# Patient Record
Sex: Female | Born: 1935 | Race: White | Hispanic: No | Marital: Married | Smoking: Never smoker
Health system: Southern US, Community
[De-identification: ages and names within clinical notes are randomized; demographics above are authoritative.]

## PROBLEM LIST (undated history)

## (undated) DIAGNOSIS — I10 Essential (primary) hypertension: Secondary | ICD-10-CM

## (undated) DIAGNOSIS — E039 Hypothyroidism, unspecified: Secondary | ICD-10-CM

---

## 2005-01-02 ENCOUNTER — Ambulatory Visit: Payer: Self-pay | Admitting: Internal Medicine

## 2005-06-20 ENCOUNTER — Ambulatory Visit: Payer: Self-pay | Admitting: Internal Medicine

## 2005-07-12 ENCOUNTER — Ambulatory Visit: Payer: Self-pay | Admitting: Surgery

## 2006-01-22 ENCOUNTER — Ambulatory Visit: Payer: Self-pay | Admitting: Internal Medicine

## 2007-01-30 ENCOUNTER — Ambulatory Visit: Payer: Self-pay | Admitting: Internal Medicine

## 2008-02-03 ENCOUNTER — Ambulatory Visit: Payer: Self-pay | Admitting: Internal Medicine

## 2009-02-03 ENCOUNTER — Ambulatory Visit: Payer: Self-pay | Admitting: Internal Medicine

## 2009-02-15 ENCOUNTER — Ambulatory Visit: Payer: Self-pay | Admitting: Internal Medicine

## 2009-08-19 ENCOUNTER — Ambulatory Visit: Payer: Self-pay | Admitting: Internal Medicine

## 2010-02-06 ENCOUNTER — Ambulatory Visit: Payer: Self-pay | Admitting: Internal Medicine

## 2011-04-04 ENCOUNTER — Ambulatory Visit: Payer: Self-pay | Admitting: Internal Medicine

## 2011-05-18 ENCOUNTER — Ambulatory Visit: Payer: Self-pay | Admitting: Unknown Physician Specialty

## 2011-05-21 ENCOUNTER — Ambulatory Visit: Payer: Self-pay | Admitting: Internal Medicine

## 2012-04-04 ENCOUNTER — Ambulatory Visit: Payer: Self-pay | Admitting: Internal Medicine

## 2013-04-06 ENCOUNTER — Ambulatory Visit: Payer: Self-pay | Admitting: Internal Medicine

## 2013-12-25 ENCOUNTER — Ambulatory Visit: Payer: Self-pay | Admitting: Internal Medicine

## 2014-03-24 ENCOUNTER — Emergency Department: Payer: Self-pay | Admitting: Emergency Medicine

## 2014-03-24 LAB — URINALYSIS, COMPLETE
BILIRUBIN, UR: NEGATIVE
Bacteria: NONE SEEN
Glucose,UR: NEGATIVE mg/dL (ref 0–75)
KETONE: NEGATIVE
NITRITE: NEGATIVE
Ph: 8 (ref 4.5–8.0)
Protein: NEGATIVE
RBC,UR: 16 /HPF (ref 0–5)
SPECIFIC GRAVITY: 1.006 (ref 1.003–1.030)
Squamous Epithelial: 2
WBC UR: 10 /HPF (ref 0–5)

## 2014-03-24 LAB — BASIC METABOLIC PANEL
ANION GAP: 8 (ref 7–16)
BUN: 13 mg/dL (ref 7–18)
CALCIUM: 8.7 mg/dL (ref 8.5–10.1)
CHLORIDE: 99 mmol/L (ref 98–107)
Co2: 32 mmol/L (ref 21–32)
Creatinine: 0.74 mg/dL (ref 0.60–1.30)
EGFR (Non-African Amer.): >60
GLUCOSE: 118 mg/dL — AB (ref 65–99)
OSMOLALITY: 279 (ref 275–301)
Potassium: 3.5 mmol/L (ref 3.5–5.1)
Sodium: 139 mmol/L (ref 136–145)

## 2014-03-24 LAB — TROPONIN I
TROPONIN-I: 0.02 ng/mL
Troponin-I: 0.03 ng/mL

## 2014-03-24 LAB — CBC
HCT: 40 % (ref 35.0–47.0)
HGB: 13.5 g/dL (ref 12.0–16.0)
MCH: 30.7 pg (ref 26.0–34.0)
MCHC: 33.7 g/dL (ref 32.0–36.0)
MCV: 91 fL (ref 80–100)
PLATELETS: 318 10*3/uL (ref 150–440)
RBC: 4.39 10*6/uL (ref 3.80–5.20)
RDW: 14.5 % (ref 11.5–14.5)
WBC: 8.7 10*3/uL (ref 3.6–11.0)

## 2014-03-25 LAB — URINE CULTURE

## 2014-04-14 ENCOUNTER — Ambulatory Visit: Payer: Self-pay | Admitting: Gastroenterology

## 2014-05-06 ENCOUNTER — Ambulatory Visit: Payer: Self-pay | Admitting: Internal Medicine

## 2014-06-03 ENCOUNTER — Emergency Department: Payer: Self-pay | Admitting: Emergency Medicine

## 2014-06-03 LAB — CBC
HCT: 39.7 % (ref 35.0–47.0)
HGB: 13.1 g/dL (ref 12.0–16.0)
MCH: 31.6 pg (ref 26.0–34.0)
MCHC: 33 g/dL (ref 32.0–36.0)
MCV: 96 fL (ref 80–100)
PLATELETS: 242 10*3/uL (ref 150–440)
RBC: 4.16 10*6/uL (ref 3.80–5.20)
RDW: 14 % (ref 11.5–14.5)
WBC: 6.4 10*3/uL (ref 3.6–11.0)

## 2014-06-03 LAB — PROTIME-INR
INR: 1.2
PROTHROMBIN TIME: 14.9 s — AB (ref 11.5–14.7)

## 2018-05-06 ENCOUNTER — Inpatient Hospital Stay
Admission: EM | Admit: 2018-05-06 | Discharge: 2018-05-13 | DRG: 065 | Disposition: A | Discharge status: Hospice | Payer: Medicare Other | Attending: Internal Medicine | Admitting: Internal Medicine

## 2018-05-06 ENCOUNTER — Other Ambulatory Visit: Payer: Self-pay

## 2018-05-06 ENCOUNTER — Inpatient Hospital Stay: Payer: Medicare Other

## 2018-05-06 ENCOUNTER — Emergency Department: Payer: Medicare Other

## 2018-05-06 DIAGNOSIS — L8990 Pressure ulcer of unspecified site, unspecified stage: Secondary | ICD-10-CM

## 2018-05-06 DIAGNOSIS — H5340 Unspecified visual field defects: Secondary | ICD-10-CM | POA: Diagnosis present

## 2018-05-06 DIAGNOSIS — F419 Anxiety disorder, unspecified: Secondary | ICD-10-CM | POA: Diagnosis present

## 2018-05-06 DIAGNOSIS — I4891 Unspecified atrial fibrillation: Secondary | ICD-10-CM | POA: Diagnosis present

## 2018-05-06 DIAGNOSIS — I639 Cerebral infarction, unspecified: Secondary | ICD-10-CM | POA: Diagnosis present

## 2018-05-06 DIAGNOSIS — K219 Gastro-esophageal reflux disease without esophagitis: Secondary | ICD-10-CM | POA: Diagnosis present

## 2018-05-06 DIAGNOSIS — I63032 Cerebral infarction due to thrombosis of left carotid artery: Secondary | ICD-10-CM | POA: Diagnosis present

## 2018-05-06 DIAGNOSIS — R471 Dysarthria and anarthria: Secondary | ICD-10-CM | POA: Diagnosis present

## 2018-05-06 DIAGNOSIS — I499 Cardiac arrhythmia, unspecified: Secondary | ICD-10-CM | POA: Diagnosis not present

## 2018-05-06 DIAGNOSIS — R4701 Aphasia: Secondary | ICD-10-CM | POA: Diagnosis present

## 2018-05-06 DIAGNOSIS — I471 Supraventricular tachycardia: Secondary | ICD-10-CM | POA: Diagnosis present

## 2018-05-06 DIAGNOSIS — G459 Transient cerebral ischemic attack, unspecified: Secondary | ICD-10-CM

## 2018-05-06 DIAGNOSIS — I959 Hypotension, unspecified: Secondary | ICD-10-CM | POA: Diagnosis not present

## 2018-05-06 DIAGNOSIS — Z8249 Family history of ischemic heart disease and other diseases of the circulatory system: Secondary | ICD-10-CM

## 2018-05-06 DIAGNOSIS — E876 Hypokalemia: Secondary | ICD-10-CM | POA: Diagnosis not present

## 2018-05-06 DIAGNOSIS — E782 Mixed hyperlipidemia: Secondary | ICD-10-CM | POA: Diagnosis present

## 2018-05-06 DIAGNOSIS — J189 Pneumonia, unspecified organism: Secondary | ICD-10-CM

## 2018-05-06 DIAGNOSIS — Z888 Allergy status to other drugs, medicaments and biological substances status: Secondary | ICD-10-CM

## 2018-05-06 DIAGNOSIS — I739 Peripheral vascular disease, unspecified: Secondary | ICD-10-CM | POA: Diagnosis present

## 2018-05-06 DIAGNOSIS — Z515 Encounter for palliative care: Secondary | ICD-10-CM | POA: Diagnosis not present

## 2018-05-06 DIAGNOSIS — Z7902 Long term (current) use of antithrombotics/antiplatelets: Secondary | ICD-10-CM

## 2018-05-06 DIAGNOSIS — Z880 Allergy status to penicillin: Secondary | ICD-10-CM

## 2018-05-06 DIAGNOSIS — G9349 Other encephalopathy: Secondary | ICD-10-CM | POA: Diagnosis present

## 2018-05-06 DIAGNOSIS — Z7982 Long term (current) use of aspirin: Secondary | ICD-10-CM

## 2018-05-06 DIAGNOSIS — M6282 Rhabdomyolysis: Secondary | ICD-10-CM | POA: Diagnosis present

## 2018-05-06 DIAGNOSIS — R000 Tachycardia, unspecified: Secondary | ICD-10-CM | POA: Diagnosis not present

## 2018-05-06 DIAGNOSIS — Z79899 Other long term (current) drug therapy: Secondary | ICD-10-CM

## 2018-05-06 DIAGNOSIS — G8101 Flaccid hemiplegia affecting right dominant side: Secondary | ICD-10-CM | POA: Diagnosis present

## 2018-05-06 DIAGNOSIS — Z66 Do not resuscitate: Secondary | ICD-10-CM | POA: Diagnosis present

## 2018-05-06 DIAGNOSIS — N3289 Other specified disorders of bladder: Secondary | ICD-10-CM | POA: Diagnosis not present

## 2018-05-06 DIAGNOSIS — J9811 Atelectasis: Secondary | ICD-10-CM | POA: Diagnosis not present

## 2018-05-06 DIAGNOSIS — L89152 Pressure ulcer of sacral region, stage 2: Secondary | ICD-10-CM | POA: Diagnosis not present

## 2018-05-06 DIAGNOSIS — R29712 NIHSS score 12: Secondary | ICD-10-CM | POA: Diagnosis present

## 2018-05-06 DIAGNOSIS — I4892 Unspecified atrial flutter: Secondary | ICD-10-CM | POA: Diagnosis present

## 2018-05-06 DIAGNOSIS — Z79890 Hormone replacement therapy (postmenopausal): Secondary | ICD-10-CM

## 2018-05-06 DIAGNOSIS — E039 Hypothyroidism, unspecified: Secondary | ICD-10-CM | POA: Diagnosis present

## 2018-05-06 DIAGNOSIS — R531 Weakness: Secondary | ICD-10-CM | POA: Diagnosis present

## 2018-05-06 DIAGNOSIS — Z882 Allergy status to sulfonamides status: Secondary | ICD-10-CM

## 2018-05-06 DIAGNOSIS — Z881 Allergy status to other antibiotic agents status: Secondary | ICD-10-CM

## 2018-05-06 DIAGNOSIS — I5030 Unspecified diastolic (congestive) heart failure: Secondary | ICD-10-CM | POA: Diagnosis not present

## 2018-05-06 DIAGNOSIS — Z794 Long term (current) use of insulin: Secondary | ICD-10-CM

## 2018-05-06 HISTORY — DX: Hypothyroidism, unspecified: E03.9

## 2018-05-06 HISTORY — DX: Essential (primary) hypertension: I10

## 2018-05-06 LAB — CBC
HEMATOCRIT: 44.8 % (ref 36.0–46.0)
HEMOGLOBIN: 15.5 g/dL — AB (ref 12.0–15.0)
MCH: 30.6 pg (ref 26.0–34.0)
MCHC: 34.6 g/dL (ref 30.0–36.0)
MCV: 88.4 fL (ref 80.0–100.0)
NRBC: 0 % (ref 0.0–0.2)
Platelets: 246 10*3/uL (ref 150–400)
RBC: 5.07 MIL/uL (ref 3.87–5.11)
RDW: 12.9 % (ref 11.5–15.5)
WBC: 15.2 10*3/uL — AB (ref 4.0–10.5)

## 2018-05-06 LAB — DIFFERENTIAL
Abs Immature Granulocytes: 0.08 10*3/uL — ABNORMAL HIGH (ref 0.00–0.07)
BASOS ABS: 0 10*3/uL (ref 0.0–0.1)
Basophils Relative: 0 %
Eosinophils Absolute: 0 10*3/uL (ref 0.0–0.5)
Eosinophils Relative: 0 %
IMMATURE GRANULOCYTES: 1 %
LYMPHS ABS: 0.7 10*3/uL (ref 0.7–4.0)
LYMPHS PCT: 5 %
Monocytes Absolute: 0.9 10*3/uL (ref 0.1–1.0)
Monocytes Relative: 6 %
NEUTROS ABS: 13.6 10*3/uL — AB (ref 1.7–7.7)
Neutrophils Relative %: 88 %

## 2018-05-06 LAB — ETHANOL

## 2018-05-06 LAB — URINALYSIS, COMPLETE (UACMP) WITH MICROSCOPIC
BILIRUBIN URINE: NEGATIVE
Bacteria, UA: NONE SEEN
Glucose, UA: 50 mg/dL — AB
Ketones, ur: 20 mg/dL — AB
LEUKOCYTES UA: NEGATIVE
Nitrite: NEGATIVE
Protein, ur: 100 mg/dL — AB
RBC / HPF: >50 RBC/hpf — ABNORMAL HIGH (ref 0–5)
SPECIFIC GRAVITY, URINE: 1.016 (ref 1.005–1.030)
pH: 6 (ref 5.0–8.0)

## 2018-05-06 LAB — PROTIME-INR
INR: 1.18
PROTHROMBIN TIME: 14.9 s (ref 11.4–15.2)

## 2018-05-06 LAB — URINE DRUG SCREEN, QUALITATIVE (ARMC ONLY)
Amphetamines, Ur Screen: NOT DETECTED
BARBITURATES, UR SCREEN: NOT DETECTED
Benzodiazepine, Ur Scrn: POSITIVE — AB
COCAINE METABOLITE, UR ~~LOC~~: NOT DETECTED
Cannabinoid 50 Ng, Ur ~~LOC~~: NOT DETECTED
MDMA (Ecstasy)Ur Screen: NOT DETECTED
METHADONE SCREEN, URINE: NOT DETECTED
OPIATE, UR SCREEN: NOT DETECTED
PHENCYCLIDINE (PCP) UR S: NOT DETECTED
Tricyclic, Ur Screen: NOT DETECTED

## 2018-05-06 LAB — COMPREHENSIVE METABOLIC PANEL
ALK PHOS: 58 U/L (ref 38–126)
ALT: 28 U/L (ref 0–44)
AST: 61 U/L — ABNORMAL HIGH (ref 15–41)
Albumin: 4.1 g/dL (ref 3.5–5.0)
Anion gap: 15 (ref 5–15)
BILIRUBIN TOTAL: 2 mg/dL — AB (ref 0.3–1.2)
BUN: 21 mg/dL (ref 8–23)
CALCIUM: 9.2 mg/dL (ref 8.9–10.3)
CO2: 22 mmol/L (ref 22–32)
Chloride: 102 mmol/L (ref 98–111)
Creatinine, Ser: 0.63 mg/dL (ref 0.44–1.00)
GFR calc Af Amer: >60 mL/min (ref >60)
GFR calc non Af Amer: >60 mL/min (ref >60)
Glucose, Bld: 126 mg/dL — ABNORMAL HIGH (ref 70–99)
Potassium: 3.4 mmol/L — ABNORMAL LOW (ref 3.5–5.1)
SODIUM: 139 mmol/L (ref 135–145)
Total Protein: 7.1 g/dL (ref 6.5–8.1)

## 2018-05-06 LAB — CK: CK TOTAL: 1678 U/L — AB (ref 38–234)

## 2018-05-06 LAB — TROPONIN I: Troponin I: 0.03 ng/mL (ref <0.03)

## 2018-05-06 LAB — APTT: aPTT: 32 seconds (ref 24–36)

## 2018-05-06 MED ORDER — LORAZEPAM 2 MG/ML IJ SOLN: 0.5000 mg | Freq: Four times a day (QID) | INTRAMUSCULAR | Status: DC | PRN

## 2018-05-06 MED ORDER — SODIUM CHLORIDE 0.9 % IV SOLN
INTRAVENOUS | Status: DC
  Administered 2018-05-06 – 2018-05-07 (×3): via INTRAVENOUS

## 2018-05-06 MED ORDER — METOPROLOL SUCCINATE ER 50 MG PO TB24: 200.0000 mg | ORAL_TABLET | Freq: Every day | ORAL | Status: DC

## 2018-05-06 MED ORDER — ASPIRIN 325 MG PO TABS
325.0000 mg | ORAL_TABLET | Freq: Every day | ORAL | Status: DC
  Administered 2018-05-10 – 2018-05-12 (×3): 325 mg via ORAL
  Filled 2018-05-06 (×5): qty 1

## 2018-05-06 MED ORDER — STROKE: EARLY STAGES OF RECOVERY BOOK
Freq: Once | Status: AC
  Administered 2018-05-06: 19:00:00

## 2018-05-06 MED ORDER — ACETAMINOPHEN 650 MG RE SUPP: 650.0000 mg | RECTAL | Status: DC | PRN

## 2018-05-06 MED ORDER — LEVOTHYROXINE SODIUM 50 MCG PO TABS
50.0000 ug | ORAL_TABLET | Freq: Every day | ORAL | Status: DC
  Administered 2018-05-10 – 2018-05-13 (×4): 50 ug via ORAL
  Filled 2018-05-06 (×4): qty 1

## 2018-05-06 MED ORDER — ACETAMINOPHEN 160 MG/5ML PO SOLN
650.0000 mg | ORAL | Status: DC | PRN
  Filled 2018-05-06: qty 20.3

## 2018-05-06 MED ORDER — ASPIRIN 300 MG RE SUPP
300.0000 mg | Freq: Every day | RECTAL | Status: DC
  Administered 2018-05-07 – 2018-05-13 (×4): 300 mg via RECTAL
  Filled 2018-05-06 (×7): qty 1

## 2018-05-06 MED ORDER — SODIUM CHLORIDE 0.9 % IV BOLUS
1000.0000 mL | Freq: Once | INTRAVENOUS | Status: AC
  Administered 2018-05-06: 1000 mL via INTRAVENOUS

## 2018-05-06 MED ORDER — HYDRALAZINE HCL 20 MG/ML IJ SOLN
10.0000 mg | Freq: Four times a day (QID) | INTRAMUSCULAR | Status: DC | PRN
  Filled 2018-05-06: qty 1

## 2018-05-06 MED ORDER — ACETAMINOPHEN 325 MG PO TABS
650.0000 mg | ORAL_TABLET | ORAL | Status: DC | PRN
  Filled 2018-05-06: qty 2

## 2018-05-06 MED ORDER — ENOXAPARIN SODIUM 40 MG/0.4ML ~~LOC~~ SOLN
40.0000 mg | SUBCUTANEOUS | Status: DC
  Administered 2018-05-06 – 2018-05-12 (×7): 40 mg via SUBCUTANEOUS
  Filled 2018-05-06 (×7): qty 0.4

## 2018-05-06 NOTE — H&P (Signed)
[facility] [place] [facility]   PATIENT NAME: Jenny Molina    MR#:  [id number]  DATE OF BIRTH:  1923-11-24  DATE OF ADMISSION:  05/06/2018  PRIMARY CARE PHYSICIAN: Patient, No Pcp Per   REQUESTING/REFERRING PHYSICIAN: Dr. Scotty Court   CHIEF COMPLAINT:   Chief Complaint  Patient presents with  . Fall    HISTORY OF PRESENT ILLNESS:  Jenny Molina  is a 84 y.o. female with a known history of hypertension, hypothyroidism who is a resident of an independent living facility was found on the floor with last well-known 3 days back.  Patient is drowsy but answers a few questions.  From history from her facility she has been on the floor for 2 to 3 days.  Here she has been found to have right hemiparesis with left internal capsule CVA.  CK is elevated at 1600.  Blood pressure is elevated. Patient is being admitted for acute CVA and rhabdomyolysis.  PAST MEDICAL HISTORY:   Past Medical History:  Diagnosis Date  . HTN (hypertension)   . Hypothyroidism     PAST SURGICAL HISTORY:  History reviewed. No pertinent surgical history.  SOCIAL HISTORY:   Social History   Tobacco Use  . Smoking status: Unknown If Ever Smoked  Substance Use Topics  . Alcohol use: Not Currently    Frequency: Never    Comment: unable to verbalize    FAMILY HISTORY:   Family History  Problem Relation Age of Onset  . Hypertension Mother   . CAD Father     DRUG ALLERGIES:  Not on File  REVIEW OF SYSTEMS:   Review of Systems  Unable to perform ROS: Mental status change    MEDICATIONS AT HOME:   Prior to Admission medications   Medication Sig Start Date End Date Taking? Authorizing Provider  ALPRAZolam Prudy Feeler) 0.5 MG tablet Take 1 tablet by mouth every 8 (eight) hours. 04/01/18  Yes [provider]  amLODipine (NORVASC) 5 MG tablet Take 1 tablet by mouth daily. 01/06/18  Yes [provider]  furosemide (LASIX) 20 MG tablet Take 1 tablet by mouth daily. 04/08/18  04/08/19 Yes [provider]  levothyroxine (SYNTHROID, LEVOTHROID) 50 MCG tablet Take 1 tablet by mouth daily before breakfast. 05/10/17  Yes [provider]  metoprolol (TOPROL-XL) 200 MG 24 hr tablet Take 1 tablet by mouth daily. 05/10/17  Yes [provider]     VITAL SIGNS:  Blood pressure (!) 157/70, pulse 68, temperature 98.8 F (37.1 C), temperature source Oral, resp. rate 15, height 5\' 5"  (1.651 m), weight 68 kg, SpO2 100 %.  PHYSICAL EXAMINATION:  Physical Exam  GENERAL:  84 y.o.-year-old patient lying in the bed with no acute distress.  EYES: Pupils equal, round, reactive to light and accommodation. No scleral icterus. Extraocular muscles intact.  HEENT: Head atraumatic, normocephalic. Oropharynx and nasopharynx clear. No oropharyngeal erythema, moist oral mucosa  NECK:  Supple, no jugular venous distention. No thyroid enlargement, no tenderness.  LUNGS: Normal breath sounds bilaterally, no wheezing, rales, rhonchi. No use of accessory muscles of respiration.  CARDIOVASCULAR: S1, S2 normal. No murmurs, rubs, or gallops.  ABDOMEN: Soft, nontender, nondistended. Bowel sounds present. No organomegaly or mass.  EXTREMITIES: No pedal edema, cyanosis, or clubbing. + 2 pedal & radial pulses b/l.   NEUROLOGIC: Right hemiparesis PSYCHIATRIC: The patient is drwozy SKIN: No obvious rash, lesion, or ulcer.   LABORATORY PANEL:   CBC Recent Labs  Lab 05/06/18 1419  WBC 15.2*  HGB 15.5*  HCT 44.8  PLT 246   ------------------------------------------------------------------------------------------------------------------  Chemistries  Recent Labs  Lab 05/06/18 1419  NA 139  K 3.4*  CL 102  CO2 22  GLUCOSE 126*  BUN 21  CREATININE 0.63  CALCIUM 9.2  AST 61*  ALT 28  ALKPHOS 58  BILITOT 2.0*   ------------------------------------------------------------------------------------------------------------------  Cardiac Enzymes Recent Labs  Lab  05/06/18 1419  TROPONINI 0.03*   ------------------------------------------------------------------------------------------------------------------  RADIOLOGY:  Ct Head Wo Contrast  Result Date: 05/06/2018 CLINICAL DATA:  84 year old female fell and presumed to be on floor for 3 days per neighbor. Right-sided weakness. Initial encounter. EXAM: CT HEAD WITHOUT CONTRAST CT CERVICAL SPINE WITHOUT CONTRAST TECHNIQUE: Multidetector CT imaging of the head and cervical spine was performed following the standard protocol without intravenous contrast. Multiplanar CT image reconstructions of the cervical spine were also generated. COMPARISON:  12/25/2013 CT. FINDINGS: CT HEAD FINDINGS Brain: Age-indeterminate infarct versus prominent chronic microvascular changes genu of the left internal capsule. Otherwise no CT evidence of large acute infarct. No intracranial hemorrhage. Prominent chronic microvascular changes. Global atrophy. No intracranial mass lesion noted on this unenhanced exam. Vascular: No hyperdense vessel. Skull: No skull fracture. Sinuses/Orbits: Post lens replacement. No acute orbital abnormality. Mucosal thickening ethmoid sinus air cells. Other: Mastoid air cells and middle ear cavities are clear. CT CERVICAL SPINE FINDINGS Alignment: Mild rotation head and C1 upon C2. Skull base and vertebrae: No cervical spine fracture. Soft tissues and spinal canal: No abnormal prevertebral soft tissue swelling. Disc levels: Cervical spondylotic changes most notable C4-5 through C6-7. No high-grade spinal stenosis. Upper chest: No worrisome abnormality. Other: No worrisome neck mass.  Carotid bifurcation calcifications. IMPRESSION: CT HEAD 1. Age-indeterminate infarct versus prominent chronic microvascular changes genu of the left internal capsule. Otherwise no CT evidence of large acute infarct. 2. No skull fracture or intracranial hemorrhage. 3. Prominent chronic microvascular changes. 4. Global atrophy. CT  CERVICAL SPINE 1. Mild rotation head/C1 upon C2 may be related to head position. 2. No cervical spine fracture or abnormal prevertebral soft tissue swelling. 3. Spondylotic changes most notable C4-5 through C6-7. No high-grade spinal stenosis. Electronically Signed   By: Lacy Duverney M.D.   On: 05/06/2018 15:27   Ct Cervical Spine Wo Contrast  Result Date: 05/06/2018 CLINICAL DATA:  84 year old female fell and presumed to be on floor for 3 days per neighbor. Right-sided weakness. Initial encounter. EXAM: CT HEAD WITHOUT CONTRAST CT CERVICAL SPINE WITHOUT CONTRAST TECHNIQUE: Multidetector CT imaging of the head and cervical spine was performed following the standard protocol without intravenous contrast. Multiplanar CT image reconstructions of the cervical spine were also generated. COMPARISON:  12/25/2013 CT. FINDINGS: CT HEAD FINDINGS Brain: Age-indeterminate infarct versus prominent chronic microvascular changes genu of the left internal capsule. Otherwise no CT evidence of large acute infarct. No intracranial hemorrhage. Prominent chronic microvascular changes. Global atrophy. No intracranial mass lesion noted on this unenhanced exam. Vascular: No hyperdense vessel. Skull: No skull fracture. Sinuses/Orbits: Post lens replacement. No acute orbital abnormality. Mucosal thickening ethmoid sinus air cells. Other: Mastoid air cells and middle ear cavities are clear. CT CERVICAL SPINE FINDINGS Alignment: Mild rotation head and C1 upon C2. Skull base and vertebrae: No cervical spine fracture. Soft tissues and spinal canal: No abnormal prevertebral soft tissue swelling. Disc levels: Cervical spondylotic changes most notable C4-5 through C6-7. No high-grade spinal stenosis. Upper chest: No worrisome abnormality. Other: No worrisome neck mass.  Carotid bifurcation calcifications. IMPRESSION: CT HEAD 1. Age-indeterminate infarct versus prominent chronic microvascular  changes genu of the left internal capsule.  Otherwise no CT evidence of large acute infarct. 2. No skull fracture or intracranial hemorrhage. 3. Prominent chronic microvascular changes. 4. Global atrophy. CT CERVICAL SPINE 1. Mild rotation head/C1 upon C2 may be related to head position. 2. No cervical spine fracture or abnormal prevertebral soft tissue swelling. 3. Spondylotic changes most notable C4-5 through C6-7. No high-grade spinal stenosis. Electronically Signed   By: Lacy Duverney M.D.   On: 05/06/2018 15:27   Ct Pelvis Wo Contrast  Result Date: 05/06/2018 CLINICAL DATA:  Patient status post fall at some point over the past 3 days. Pelvic pain. Initial encounter. EXAM: CT PELVIS WITHOUT CONTRAST TECHNIQUE: Multidetector CT imaging of the pelvis was performed following the standard protocol without intravenous contrast. COMPARISON:  CT abdomen and pelvis 03/24/2014. FINDINGS: Urinary Tract: The urinary bladder is completely decompressed with a Foley catheter in place. Bowel: Sigmoid diverticulosis is noted. Imaged bowel loops are otherwise unremarkable. Vascular/Lymphatic: Atherosclerotic vascular disease is seen. No lymphadenopathy. Reproductive:  No mass or other significant abnormality Other:  No fluid collection. Musculoskeletal: The hips are located. No fracture is identified. Small bone islands in the right sacrum and left ilium are unchanged. Mild right hip joint space narrowing is identified. Mild degenerative change is also seen at the symphysis pubis. There is some facet arthropathy and a disc bulge at L5-S1. IMPRESSION: Negative for fracture.  No acute abnormality. Lower lumbar spondylosis. Mild degenerative change right hip and symphysis pubis also noted. Atherosclerosis. Diverticulosis. Electronically Signed   By: Drusilla Kanner M.D.   On: 05/06/2018 15:15   Dg Chest Portable 1 View  Result Date: 05/06/2018 CLINICAL DATA:  Right-sided weakness, fall EXAM: PORTABLE CHEST 1 VIEW COMPARISON:  03/24/2014, CT 05/06/2014 FINDINGS:  The patient's hand obscures the left lower chest. Small right-sided pleural effusion. Enlarged cardiomediastinal silhouette with vascular congestion and hazy atelectasis or edema at the bases. No pneumothorax. IMPRESSION: 1. Cardiomegaly with vascular congestion and hazy bibasilar atelectasis or mild edema. 2. Tiny right pleural effusion Electronically Signed   By: Jasmine Pang M.D.   On: 05/06/2018 14:58     IMPRESSION AND PLAN:   *Acute CVA of left internal capsule -Check MRI of the brain, Carotid dopplers, Echo - Start aspirin .  Statin once patient able to swallow - Lovenox for DVT prophylaxis. - PT/OT/Speech consult as needed per symptoms - Neuro checks every 4 hours for 24 hours. - Consult neurology.  *Uncontrolled hypertension.  Will restart metoprolol tomorrow.  Hold amlodipine.  Permissive hypertension.  *Hypothyroidism.  Levothyroxine and patient can swallow  *Acute rhabdomyolysis.  Start IV fluids.  Repeat CK in the morning.  Normal renal function.  DVT prophylaxis with Lovenox    All the records are reviewed and case discussed with ED provider. Management plans discussed with the patient, family and they are in agreement.  CODE STATUS: FULL CODE  TOTAL TIME TAKING CARE OF THIS PATIENT: 40 minutes.   Molinda Bailiff Ehsan Corvin M.D on 05/06/2018 at 4:28 PM  Between 7am to 6pm - Pager - [phone]  After 6pm go to [email or web address] - password [email or web address]  [facility]  Office  [phone]  CC: Primary care physician; Patient, No Pcp Per  Note: This dictation was prepared with Dragon dictation along with smaller phrase technology. Any transcriptional errors that result from this process are unintentional.

## 2018-05-06 NOTE — ED Provider Notes (Signed)
[facility] Emergency Department Provider Note  ____________________________________________  Time seen: Approximately 3:31 PM  I have reviewed the triage vital signs and the nursing notes.   HISTORY  Chief Complaint Fall  Level 5 Caveat: Portions of the History and Physical including HPI and review of systems are unable to be completely obtained due to patient being a poor historian    HPI Jenny Molina is a 84 y.o. female with a past history of hypothyroidism, hypertension who was found in her home at [facility] independent living on the floor having fallen.  Last known well 3 days ago.  With right-sided weakness and a aphasia according to EMS.  Patient is unable to provide any significant history.  She does endorse headache.      History reviewed. No pertinent past medical history.  Allergies Reconcile with Patient's Chart  Active Allergy Reactions Severity Noted Date Comments  Olmesartan Unknown  09/30/2013   Ciprofloxacin (Bulk) Unknown  09/30/2013   Hydralazine Other (See Comments)  07/28/2014 Felt like she couldn't walk or see among other things   Hydrochlorothiazide (Bulk) Unknown  09/30/2013   Penicillins Unknown  09/30/2013   Sulfa (Sulfonamide Antibiotics) Unknown  09/30/2013   Medications Reconcile with Patient's Chart  Medication Sig Dispensed Refills Start Date End Date Status  levothyroxine (SYNTHROID, LEVOTHROID) 50 MCG tablet  Indications: Acquired hypothyroidism, unspecified Take 1 tablet (50 mcg total) by mouth once daily Take on an empty stomach with a glass of water at least 30-60 minutes before breakfast. 30 tablet  11 05/10/2017  Active  metoprolol succinate (TOPROL-XL) 200 MG XL tablet  Indications: Essential hypertension Take 1 tablet (200 mg total) by mouth once daily 30 tablet  11 05/10/2017  Active  amLODIPine (NORVASC) 5 MG tablet  Take 1 tablet (5 mg total) by mouth once daily 30 tablet  6 01/06/2018   Active  ALPRAZolam (XANAX) 0.5 MG tablet  Indications: Anxiety Take 1 tablet (0.5 mg total) by mouth every 8 (eight) hours 120 tablet  3 04/01/2018  Active  FUROsemide (LASIX) 20 MG tablet  Take 1 tablet (20 mg total) by mouth once daily 30 tablet  11 04/08/2018 04/08/2019 Active  Active Problems Reconcile with Patient's Chart  Problem Noted Date  Pedal edema 04/08/2018  SOB (shortness of breath) 04/08/2018  HTN (hypertension) 11/19/2013  Hyperlipidemia 11/19/2013  Hypothyroidism 11/19/2013  Anxiety 11/19/2013  OA (osteoarthritis) 11/19/2013  Hypertension   Palpitations   Non-cardiac chest pain   Fibrocystic disease of breast   Osteoarthritis   Allergic rhinitis   Migraine   GERD (gastroesophageal reflux disease)   Hematuria   Overview:   microscopic hematuria       Allergies Patient has no allergy information on record.   History reviewed. No pertinent family history.  Social History Social History   Tobacco Use  . Smoking status: Unknown If Ever Smoked  Substance Use Topics  . Alcohol use: Not Currently    Frequency: Never    Comment: unable to verbalize  . Drug use: Not Currently    Comment: unable to verbalize    Review of Systems Level 5 Caveat: Portions of the History and Physical including HPI and review of systems are unable to be completely obtained due to patient being a poor historian due to altered mental status  ____________________________________________   PHYSICAL EXAM:  VITAL SIGNS: ED Triage Vitals  Enc Vitals Group     BP 05/06/18 1412 (!) 155/74     Pulse Rate 05/06/18  1412 68     Resp 05/06/18 1412 17     Temp 05/06/18 1412 98.8 F (37.1 C)     Temp Source 05/06/18 1412 Oral     SpO2 05/06/18 1412 97 %     Weight 05/06/18 1414 150 lb (68 kg)     Height 05/06/18 1414 5\' 5"  (1.651 m)     Head Circumference --      Peak Flow --      Pain Score 05/06/18 1414 10     Pain Loc --      Pain Edu? --      Excl. in  GC? --     Vital signs reviewed, nursing assessments reviewed.   Constitutional:   Awake and alert.  Ill-appearing . Eyes:   Conjunctivae are normal.  Right lateral gaze palsy. ENT      Head:   Normocephalic and atraumatic.      Nose:   No congestion/rhinnorhea.       Mouth/Throat:   Dry mucous membranes, no pharyngeal erythema. No peritonsillar mass.       Neck:   No meningismus. Full ROM. Hematological/Lymphatic/Immunilogical:   No cervical lymphadenopathy. Cardiovascular:   RRR. Symmetric bilateral radial and DP pulses.  No murmurs. Cap refill less than 2 seconds. Respiratory:   Normal respiratory effort without tachypnea/retractions. Breath sounds are clear and equal bilaterally. No wheezes/rales/rhonchi. Gastrointestinal:   Soft and nontender. Non distended. There is no CVA tenderness.  No rebound, rigidity, or guarding.  Musculoskeletal:   Normal range of motion in all extremities. No joint effusions.  No lower extremity tenderness.  No edema. Neurologic:   Dysarthric speech.  Limited language range Right upper extremity paralysis Right lower extremity paralysis but present withdrawal from painful stimulus Left side motor unremarkable Sensation intact NIH stroke scale approximately 12   Skin:    Skin is warm, dry and intact. No rash noted.  No petechiae, purpura, or bullae.  ____________________________________________    LABS (pertinent positives/negatives) (all labs ordered are listed, but only abnormal results are displayed) Labs Reviewed  CBC - Abnormal; Notable for the following components:      Result Value   WBC 15.2 (*)    Hemoglobin 15.5 (*)    All other components within normal limits  DIFFERENTIAL - Abnormal; Notable for the following components:   Neutro Abs 13.6 (*)    Abs Immature Granulocytes 0.08 (*)    All other components within normal limits  URINE DRUG SCREEN, QUALITATIVE ([facility] ONLY) - Abnormal; Notable for the following components:    Benzodiazepine, Ur Scrn POSITIVE (*)    All other components within normal limits  URINALYSIS, COMPLETE (UACMP) WITH MICROSCOPIC - Abnormal; Notable for the following components:   Color, Urine YELLOW (*)    APPearance CLEAR (*)    Glucose, UA 50 (*)    Hgb urine dipstick LARGE (*)    Ketones, ur 20 (*)    Protein, ur 100 (*)    RBC / HPF >50 (*)    All other components within normal limits  ETHANOL  PROTIME-INR  APTT  COMPREHENSIVE METABOLIC PANEL  TROPONIN I  CK   ____________________________________________   EKG  Interpreted by me Sinus rhythm rate of 69, normal axis intervals QRS ST segments and T waves  ____________________________________________    RADIOLOGY  Ct Head Wo Contrast  Result Date: 05/06/2018 CLINICAL DATA:  84 year old female fell and presumed to be on floor for 3 days per neighbor. Right-sided weakness. Initial encounter.  EXAM: CT HEAD WITHOUT CONTRAST CT CERVICAL SPINE WITHOUT CONTRAST TECHNIQUE: Multidetector CT imaging of the head and cervical spine was performed following the standard protocol without intravenous contrast. Multiplanar CT image reconstructions of the cervical spine were also generated. COMPARISON:  12/25/2013 CT. FINDINGS: CT HEAD FINDINGS Brain: Age-indeterminate infarct versus prominent chronic microvascular changes genu of the left internal capsule. Otherwise no CT evidence of large acute infarct. No intracranial hemorrhage. Prominent chronic microvascular changes. Global atrophy. No intracranial mass lesion noted on this unenhanced exam. Vascular: No hyperdense vessel. Skull: No skull fracture. Sinuses/Orbits: Post lens replacement. No acute orbital abnormality. Mucosal thickening ethmoid sinus air cells. Other: Mastoid air cells and middle ear cavities are clear. CT CERVICAL SPINE FINDINGS Alignment: Mild rotation head and C1 upon C2. Skull base and vertebrae: No cervical spine fracture. Soft tissues and spinal canal: No abnormal  prevertebral soft tissue swelling. Disc levels: Cervical spondylotic changes most notable C4-5 through C6-7. No high-grade spinal stenosis. Upper chest: No worrisome abnormality. Other: No worrisome neck mass.  Carotid bifurcation calcifications. IMPRESSION: CT HEAD 1. Age-indeterminate infarct versus prominent chronic microvascular changes genu of the left internal capsule. Otherwise no CT evidence of large acute infarct. 2. No skull fracture or intracranial hemorrhage. 3. Prominent chronic microvascular changes. 4. Global atrophy. CT CERVICAL SPINE 1. Mild rotation head/C1 upon C2 may be related to head position. 2. No cervical spine fracture or abnormal prevertebral soft tissue swelling. 3. Spondylotic changes most notable C4-5 through C6-7. No high-grade spinal stenosis. Electronically Signed   By: Lacy Duverney M.D.   On: 05/06/2018 15:27   Ct Cervical Spine Wo Contrast  Result Date: 05/06/2018 CLINICAL DATA:  84 year old female fell and presumed to be on floor for 3 days per neighbor. Right-sided weakness. Initial encounter. EXAM: CT HEAD WITHOUT CONTRAST CT CERVICAL SPINE WITHOUT CONTRAST TECHNIQUE: Multidetector CT imaging of the head and cervical spine was performed following the standard protocol without intravenous contrast. Multiplanar CT image reconstructions of the cervical spine were also generated. COMPARISON:  12/25/2013 CT. FINDINGS: CT HEAD FINDINGS Brain: Age-indeterminate infarct versus prominent chronic microvascular changes genu of the left internal capsule. Otherwise no CT evidence of large acute infarct. No intracranial hemorrhage. Prominent chronic microvascular changes. Global atrophy. No intracranial mass lesion noted on this unenhanced exam. Vascular: No hyperdense vessel. Skull: No skull fracture. Sinuses/Orbits: Post lens replacement. No acute orbital abnormality. Mucosal thickening ethmoid sinus air cells. Other: Mastoid air cells and middle ear cavities are clear. CT CERVICAL  SPINE FINDINGS Alignment: Mild rotation head and C1 upon C2. Skull base and vertebrae: No cervical spine fracture. Soft tissues and spinal canal: No abnormal prevertebral soft tissue swelling. Disc levels: Cervical spondylotic changes most notable C4-5 through C6-7. No high-grade spinal stenosis. Upper chest: No worrisome abnormality. Other: No worrisome neck mass.  Carotid bifurcation calcifications. IMPRESSION: CT HEAD 1. Age-indeterminate infarct versus prominent chronic microvascular changes genu of the left internal capsule. Otherwise no CT evidence of large acute infarct. 2. No skull fracture or intracranial hemorrhage. 3. Prominent chronic microvascular changes. 4. Global atrophy. CT CERVICAL SPINE 1. Mild rotation head/C1 upon C2 may be related to head position. 2. No cervical spine fracture or abnormal prevertebral soft tissue swelling. 3. Spondylotic changes most notable C4-5 through C6-7. No high-grade spinal stenosis. Electronically Signed   By: Lacy Duverney M.D.   On: 05/06/2018 15:27   Ct Pelvis Wo Contrast  Result Date: 05/06/2018 CLINICAL DATA:  Patient status post fall at some point over the past 3  days. Pelvic pain. Initial encounter. EXAM: CT PELVIS WITHOUT CONTRAST TECHNIQUE: Multidetector CT imaging of the pelvis was performed following the standard protocol without intravenous contrast. COMPARISON:  CT abdomen and pelvis 03/24/2014. FINDINGS: Urinary Tract: The urinary bladder is completely decompressed with a Foley catheter in place. Bowel: Sigmoid diverticulosis is noted. Imaged bowel loops are otherwise unremarkable. Vascular/Lymphatic: Atherosclerotic vascular disease is seen. No lymphadenopathy. Reproductive:  No mass or other significant abnormality Other:  No fluid collection. Musculoskeletal: The hips are located. No fracture is identified. Small bone islands in the right sacrum and left ilium are unchanged. Mild right hip joint space narrowing is identified. Mild degenerative  change is also seen at the symphysis pubis. There is some facet arthropathy and a disc bulge at L5-S1. IMPRESSION: Negative for fracture.  No acute abnormality. Lower lumbar spondylosis. Mild degenerative change right hip and symphysis pubis also noted. Atherosclerosis. Diverticulosis. Electronically Signed   By: Drusilla Kanner M.D.   On: 05/06/2018 15:15   Dg Chest Portable 1 View  Result Date: 05/06/2018 CLINICAL DATA:  Right-sided weakness, fall EXAM: PORTABLE CHEST 1 VIEW COMPARISON:  03/24/2014, CT 05/06/2014 FINDINGS: The patient's hand obscures the left lower chest. Small right-sided pleural effusion. Enlarged cardiomediastinal silhouette with vascular congestion and hazy atelectasis or edema at the bases. No pneumothorax. IMPRESSION: 1. Cardiomegaly with vascular congestion and hazy bibasilar atelectasis or mild edema. 2. Tiny right pleural effusion Electronically Signed   By: Jasmine Pang M.D.   On: 05/06/2018 14:58    ____________________________________________   PROCEDURES Procedures  ____________________________________________    CLINICAL IMPRESSION / ASSESSMENT AND PLAN / ED COURSE  Pertinent labs & imaging results that were available during my care of the patient were reviewed by me and considered in my medical decision making (see chart for details).    Patient presents with likely acute ischemic stroke versus intracranial hemorrhage.  Stat CT had obtained which does show a ischemic stroke in the left motor tract.  No intracranial hemorrhage noted.  She also has elevated CK level from soft tissue compression injury.  CT negative for hip fracture.  Plan to admit for further stroke management.  Not a candidate for tpa or endovascular intervention given LKW 3 days ago.      ____________________________________________   FINAL CLINICAL IMPRESSION(S) / ED DIAGNOSES    Final diagnoses:  Acute ischemic stroke [facility])     ED Discharge Orders    None       Portions of this note were generated with dragon dictation software. Dictation errors may occur despite best attempts at proofreading.    Sharman Cheek, MD 05/06/18 [phone]

## 2018-05-06 NOTE — ED Triage Notes (Addendum)
Pt to ED from [facility] c/o fall. Per EMS pt fell and is assumed to have been on the floor for at least x3 days per neighborhood who usually talks to her every day. Pt presents with right side weakness and deficits as well as a bruise to her right hip.

## 2018-05-07 ENCOUNTER — Inpatient Hospital Stay (HOSPITAL_COMMUNITY)
Admit: 2018-05-07 | Discharge: 2018-05-07 | Disposition: A | Discharge status: Home / Self Care | Payer: Medicare Other | Attending: Internal Medicine | Admitting: Internal Medicine

## 2018-05-07 ENCOUNTER — Inpatient Hospital Stay: Payer: Medicare Other

## 2018-05-07 DIAGNOSIS — I5030 Unspecified diastolic (congestive) heart failure: Secondary | ICD-10-CM

## 2018-05-07 LAB — GLUCOSE, CAPILLARY
GLUCOSE-CAPILLARY: 80 mg/dL (ref 70–99)
GLUCOSE-CAPILLARY: 75 mg/dL (ref 70–99)
Glucose-Capillary: 74 mg/dL (ref 70–99)
Glucose-Capillary: 68 mg/dL — ABNORMAL LOW (ref 70–99)

## 2018-05-07 LAB — LIPID PANEL
CHOL/HDL RATIO: 3.1 ratio
CHOLESTEROL: 150 mg/dL (ref 0–200)
HDL: 49 mg/dL (ref >40)
LDL CALC: 87 mg/dL (ref 0–99)
Triglycerides: 71 mg/dL (ref <150)
VLDL: 14 mg/dL (ref 0–40)

## 2018-05-07 LAB — ECHOCARDIOGRAM COMPLETE
Height: 65 in
WEIGHTICAEL: 1824 [oz_av]

## 2018-05-07 LAB — HEMOGLOBIN A1C
HEMOGLOBIN A1C: 5.5 % (ref 4.8–5.6)
MEAN PLASMA GLUCOSE: 111 mg/dL

## 2018-05-07 LAB — CK: CK TOTAL: 539 U/L — AB (ref 38–234)

## 2018-05-07 MED ORDER — SODIUM CHLORIDE 0.9 % IV SOLN
100.0000 mg | Freq: Two times a day (BID) | INTRAVENOUS | Status: DC
  Administered 2018-05-07 – 2018-05-10 (×6): 100 mg via INTRAVENOUS
  Filled 2018-05-07 (×10): qty 10

## 2018-05-07 MED ORDER — INSULIN ASPART 100 UNIT/ML ~~LOC~~ SOLN: 0.0000 [IU] | Freq: Three times a day (TID) | SUBCUTANEOUS | Status: DC

## 2018-05-07 MED ORDER — IOHEXOL 350 MG/ML SOLN
75.0000 mL | Freq: Once | INTRAVENOUS | Status: AC | PRN
  Administered 2018-05-07: 13:00:00 75 mL via INTRAVENOUS

## 2018-05-07 NOTE — Progress Notes (Signed)
OT Cancellation Note  Patient Details Name: Jenny Molina MRN: [id number] DOB: [birth year]   Cancelled Treatment:    Reason Eval/Treat Not Completed: Patient at procedure or test/ unavailable. Order received, chart reviewed. Pt out of room for testing. Will re-attempt OT evaluation at later date/time as pt is available and medically appropriate.  Richrd Prime, MPH, MS, OTR/L ascom [phone] 05/07/18, 9:36 AM

## 2018-05-07 NOTE — Progress Notes (Addendum)
SLP Cancellation Note  Patient Details Name: Jenny Molina MRN: [id number] DOB: Jul 06, 1924   Cancelled treatment:       Reason Eval/Treat Not Completed: Patient not medically ready;Fatigue/lethargy limiting ability to participate;Patient at procedure or test/unavailable(chart reviewed; consulted NSG). Pt was being taken from room for test. Noted pt to be lethargic w/ head turn completely to Left side. NSG reported lethargy this morning. Noted MRI results.  ST services will f/u this PM for determination of appropriateness for BSE today vs tomorrow; MD felt pt would need to hold on BSE until tomorrow. NSG updated/agreed.   [name], MS, CCC-SLP Jenny Molina 05/07/2018, 9:59 AM

## 2018-05-07 NOTE — Progress Notes (Signed)
[employer] - [place] [facility]                                                                                                                                                                                  Patient Demographics   Jhania Etherington, is a 84 y.o. female, DOB - Jul 27, 1923, JXB:[id number]  Admit date - 05/06/2018   Admitting Physician Milagros Loll, MD  Outpatient Primary MD for the patient is Patient, No Pcp Per   LOS - 1  Subjective: Pt open eyes, not able to follow commands    Review of Systems:   CONSTITUTIONAL: unable to provide.    Vitals:   Vitals:   05/07/18 0338 05/07/18 0542 05/07/18 1000 05/07/18 1156  BP: (!) 153/66 135/64 128/70 (!) 158/66  Pulse: 70 71 77 66  Resp: 14 20 18 18   Temp: 98 F (36.7 C) (!) 97.4 F (36.3 C) 98 F (36.7 C) 98.6 F (37 C)  TempSrc: Oral Oral Oral Oral  SpO2: 96% 93% 94% 95%  Weight:      Height:        Wt Readings from Last 3 Encounters:  05/06/18 51.8 kg  05/07/18 51.7 kg     Intake/Output Summary (Last 24 hours) at 05/07/2018 1349 Last data filed at 05/07/2018 0441 Gross per 24 hour  Intake 1592.94 ml  Output 1800 ml  Net -207.06 ml    Physical Exam:   GENERAL: Pleasant-appearing in no apparent distress.  HEAD, EYES, EARS, NOSE AND THROAT: Atraumatic, normocephalic. Extraocular muscles are intact. Pupils equal and reactive to light. Sclerae anicteric. No conjunctival injection. No oro-pharyngeal erythema.  NECK: Supple. There is no jugular venous distention. No bruits, no lymphadenopathy, no thyromegaly.  HEART: Regular rate and rhythm,. No murmurs, no rubs, no clicks.  LUNGS: Clear to auscultation bilaterally. No rales or rhonchi. No wheezes.  ABDOMEN: Soft, flat, nontender, nondistended. Has good bowel sounds. No hepatosplenomegaly appreciated.  EXTREMITIES: No evidence of any cyanosis, clubbing, or peripheral edema.  +2 pedal and radial pulses bilaterally.  NEUROLOGIC: right upper  unable to move SKIN: Moist and warm with no rashes appreciated.  Psych: Not anxious, depressed LN: No inguinal LN enlargement    Antibiotics   Anti-infectives (From admission, onward)   None      Medications   Scheduled Meds: . aspirin  300 mg Rectal Daily   Or  . aspirin  325 mg Oral Daily  . enoxaparin (LOVENOX) injection  40 mg Subcutaneous Q24H  . levothyroxine  50 mcg Oral QAC breakfast   Continuous Infusions: . sodium chloride 100 mL/hr at 05/07/18 1055   PRN Meds:.acetaminophen **OR** acetaminophen (TYLENOL) oral liquid 160  mg/5 mL **OR** acetaminophen, hydrALAZINE, LORazepam   Data Review:   Micro Results No results found for this or any previous visit (from the past 240 hour(s)).  Radiology Reports Ct Head Wo Contrast  Result Date: 05/06/2018 CLINICAL DATA:  84 year old female fell and presumed to be on floor for 3 days per neighbor. Right-sided weakness. Initial encounter. EXAM: CT HEAD WITHOUT CONTRAST CT CERVICAL SPINE WITHOUT CONTRAST TECHNIQUE: Multidetector CT imaging of the head and cervical spine was performed following the standard protocol without intravenous contrast. Multiplanar CT image reconstructions of the cervical spine were also generated. COMPARISON:  12/25/2013 CT. FINDINGS: CT HEAD FINDINGS Brain: Age-indeterminate infarct versus prominent chronic microvascular changes genu of the left internal capsule. Otherwise no CT evidence of large acute infarct. No intracranial hemorrhage. Prominent chronic microvascular changes. Global atrophy. No intracranial mass lesion noted on this unenhanced exam. Vascular: No hyperdense vessel. Skull: No skull fracture. Sinuses/Orbits: Post lens replacement. No acute orbital abnormality. Mucosal thickening ethmoid sinus air cells. Other: Mastoid air cells and middle ear cavities are clear. CT CERVICAL SPINE FINDINGS Alignment: Mild rotation head and C1 upon C2. Skull base and vertebrae: No cervical spine fracture. Soft  tissues and spinal canal: No abnormal prevertebral soft tissue swelling. Disc levels: Cervical spondylotic changes most notable C4-5 through C6-7. No high-grade spinal stenosis. Upper chest: No worrisome abnormality. Other: No worrisome neck mass.  Carotid bifurcation calcifications. IMPRESSION: CT HEAD 1. Age-indeterminate infarct versus prominent chronic microvascular changes genu of the left internal capsule. Otherwise no CT evidence of large acute infarct. 2. No skull fracture or intracranial hemorrhage. 3. Prominent chronic microvascular changes. 4. Global atrophy. CT CERVICAL SPINE 1. Mild rotation head/C1 upon C2 may be related to head position. 2. No cervical spine fracture or abnormal prevertebral soft tissue swelling. 3. Spondylotic changes most notable C4-5 through C6-7. No high-grade spinal stenosis. Electronically Signed   By: Lacy Duverney M.D.   On: 05/06/2018 15:27   Ct Cervical Spine Wo Contrast  Result Date: 05/06/2018 CLINICAL DATA:  84 year old female fell and presumed to be on floor for 3 days per neighbor. Right-sided weakness. Initial encounter. EXAM: CT HEAD WITHOUT CONTRAST CT CERVICAL SPINE WITHOUT CONTRAST TECHNIQUE: Multidetector CT imaging of the head and cervical spine was performed following the standard protocol without intravenous contrast. Multiplanar CT image reconstructions of the cervical spine were also generated. COMPARISON:  12/25/2013 CT. FINDINGS: CT HEAD FINDINGS Brain: Age-indeterminate infarct versus prominent chronic microvascular changes genu of the left internal capsule. Otherwise no CT evidence of large acute infarct. No intracranial hemorrhage. Prominent chronic microvascular changes. Global atrophy. No intracranial mass lesion noted on this unenhanced exam. Vascular: No hyperdense vessel. Skull: No skull fracture. Sinuses/Orbits: Post lens replacement. No acute orbital abnormality. Mucosal thickening ethmoid sinus air cells. Other: Mastoid air cells and middle  ear cavities are clear. CT CERVICAL SPINE FINDINGS Alignment: Mild rotation head and C1 upon C2. Skull base and vertebrae: No cervical spine fracture. Soft tissues and spinal canal: No abnormal prevertebral soft tissue swelling. Disc levels: Cervical spondylotic changes most notable C4-5 through C6-7. No high-grade spinal stenosis. Upper chest: No worrisome abnormality. Other: No worrisome neck mass.  Carotid bifurcation calcifications. IMPRESSION: CT HEAD 1. Age-indeterminate infarct versus prominent chronic microvascular changes genu of the left internal capsule. Otherwise no CT evidence of large acute infarct. 2. No skull fracture or intracranial hemorrhage. 3. Prominent chronic microvascular changes. 4. Global atrophy. CT CERVICAL SPINE 1. Mild rotation head/C1 upon C2 may be related to head position. 2.  No cervical spine fracture or abnormal prevertebral soft tissue swelling. 3. Spondylotic changes most notable C4-5 through C6-7. No high-grade spinal stenosis. Electronically Signed   By: Lacy Duverney M.D.   On: 05/06/2018 15:27   Ct Pelvis Wo Contrast  Result Date: 05/06/2018 CLINICAL DATA:  Patient status post fall at some point over the past 3 days. Pelvic pain. Initial encounter. EXAM: CT PELVIS WITHOUT CONTRAST TECHNIQUE: Multidetector CT imaging of the pelvis was performed following the standard protocol without intravenous contrast. COMPARISON:  CT abdomen and pelvis 03/24/2014. FINDINGS: Urinary Tract: The urinary bladder is completely decompressed with a Foley catheter in place. Bowel: Sigmoid diverticulosis is noted. Imaged bowel loops are otherwise unremarkable. Vascular/Lymphatic: Atherosclerotic vascular disease is seen. No lymphadenopathy. Reproductive:  No mass or other significant abnormality Other:  No fluid collection. Musculoskeletal: The hips are located. No fracture is identified. Small bone islands in the right sacrum and left ilium are unchanged. Mild right hip joint space narrowing  is identified. Mild degenerative change is also seen at the symphysis pubis. There is some facet arthropathy and a disc bulge at L5-S1. IMPRESSION: Negative for fracture.  No acute abnormality. Lower lumbar spondylosis. Mild degenerative change right hip and symphysis pubis also noted. Atherosclerosis. Diverticulosis. Electronically Signed   By: Drusilla Kanner M.D.   On: 05/06/2018 15:15   Mr Brain Wo Contrast  Result Date: 05/07/2018 CLINICAL DATA:  Ataxia EXAM: MRI HEAD WITHOUT CONTRAST MRA HEAD WITHOUT CONTRAST TECHNIQUE: Multiplanar, multiecho pulse sequences of the brain and surrounding structures were obtained without intravenous contrast. Angiographic images of the head were obtained using MRA technique without contrast. COMPARISON:  Head CT 05/06/2018 FINDINGS: MRI HEAD FINDINGS BRAIN: There is abnormal diffusion restriction of the left lentiform nucleus, posterior limb of the left internal capsule and medial left temporal lobe. The midline structures are normal. Early confluent hyperintense T2-weighted signal of the periventricular and deep white matter, most commonly due to chronic ischemic microangiopathy. Generalized atrophy without lobar predilection. Single focus of chronic microhemorrhage in the left frontal lobe. SKULL AND UPPER CERVICAL SPINE: The visualized skull base, calvarium, upper cervical spine and extracranial soft tissues are normal. SINUSES/ORBITS: No fluid levels or advanced mucosal thickening. No mastoid or middle ear effusion. The orbits are normal. MRA HEAD FINDINGS Intracranial internal carotid arteries: There is diminished flow related enhancement of the left internal carotid artery lacerum, cavernous and supraclinoid segments. There is a central defect within the cavernous segment and at the carotid terminus. The right ICA is normal. Anterior cerebral arteries: Normal. Middle cerebral arteries: The right MCA is normal. The left middle cerebral artery is patent proximally.  However, there is limited flow related enhancement of the distal branches. Posterior communicating arteries: Present on the left. Posterior cerebral arteries: Normal. Basilar artery: Normal. Vertebral arteries: Codominant normal. Superior cerebellar arteries: Normal. Inferior cerebellar arteries: Normal. IMPRESSION: 1. 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 No hemorrhage or mass effect. 2. Diminished flow related enhancement of the left internal carotid artery at the skull base with suspected acute thrombus. The left middle cerebral artery is normal proximally, but there is minimal flow related enhancement of the distal branches. 3. Chronic ischemic microangiopathy and generalized atrophy. Electronically Signed   By: Deatra Robinson M.D.   On: 05/07/2018 01:58   US Carotid Bilateral (at [facility] And [facility] Only)  Result Date: 05/07/2018 CLINICAL DATA:  84 year old female with symptoms of transient ischemic attack EXAM: BILATERAL CAROTID  DUPLEX ULTRASOUND TECHNIQUE: Wallace Cullens scale imaging, color Doppler and duplex ultrasound were performed of bilateral carotid and vertebral arteries in the neck. COMPARISON:  85 year old female with symptoms of transient ischemic attack FINDINGS: Criteria: Quantification of carotid stenosis is based on velocity parameters that correlate the residual internal carotid diameter with NASCET-based stenosis levels, using the diameter of the distal internal carotid lumen as the denominator for stenosis measurement. The following velocity measurements were obtained: RIGHT ICA: 74/18 cm/sec CCA: 108/16 cm/sec SYSTOLIC ICA/CCA RATIO:  0.7 ECA:  132 cm/sec LEFT ICA: 41/4 cm/sec CCA: 9 9/7 cm/sec SYSTOLIC ICA/CCA RATIO:  0.4 ECA:  137 cm/sec RIGHT CAROTID ARTERY: No significant atherosclerotic plaque or evidence of stenosis in the internal carotid artery. RIGHT VERTEBRAL ARTERY:  Patent with normal antegrade flow. LEFT CAROTID  ARTERY: Mild focal heterogeneous atherosclerotic plaque in the proximal internal carotid artery. By peak systolic velocity criteria, the estimated stenosis remains less than 50%. LEFT VERTEBRAL ARTERY:  Patent with normal antegrade flow. IMPRESSION: 1. Mild (1-49%) stenosis proximal left internal carotid artery secondary to focal heterogeneous atherosclerotic plaque. 2. No significant atherosclerotic plaque or evidence of stenosis in the right internal carotid artery. 3. Vertebral arteries are patent with normal antegrade flow. Signed, Sterling Big, MD, RPVI [facility] Radiology Electronically Signed   By: Malachy Moan M.D.   On: 05/07/2018 10:48   Dg Chest Portable 1 View  Result Date: 05/06/2018 CLINICAL DATA:  Right-sided weakness, fall EXAM: PORTABLE CHEST 1 VIEW COMPARISON:  03/24/2014, CT 05/06/2014 FINDINGS: The patient's hand obscures the left lower chest. Small right-sided pleural effusion. Enlarged cardiomediastinal silhouette with vascular congestion and hazy atelectasis or edema at the bases. No pneumothorax. IMPRESSION: 1. Cardiomegaly with vascular congestion and hazy bibasilar atelectasis or mild edema. 2. Tiny right pleural effusion Electronically Signed   By: Jasmine Pang M.D.   On: 05/06/2018 14:58   Mr Maxine Glenn Head/brain MV Cm  Result Date: 05/07/2018 CLINICAL DATA:  Ataxia EXAM: MRI HEAD WITHOUT CONTRAST MRA HEAD WITHOUT CONTRAST TECHNIQUE: Multiplanar, multiecho pulse sequences of the brain and surrounding structures were obtained without intravenous contrast. Angiographic images of the head were obtained using MRA technique without contrast. COMPARISON:  Head CT 05/06/2018 FINDINGS: MRI HEAD FINDINGS BRAIN: There is abnormal diffusion restriction of the left lentiform nucleus, posterior limb of the left internal capsule and medial left temporal lobe. The midline structures are normal. Early confluent hyperintense T2-weighted  signal of the periventricular and deep white matter, most commonly due to chronic ischemic microangiopathy. Generalized atrophy without lobar predilection. Single focus of chronic microhemorrhage in the left frontal lobe. SKULL AND UPPER CERVICAL SPINE: The visualized skull base, calvarium, upper cervical spine and extracranial soft tissues are normal. SINUSES/ORBITS: No fluid levels or advanced mucosal thickening. No mastoid or middle ear effusion. The orbits are normal. MRA HEAD FINDINGS Intracranial internal carotid arteries: There is diminished flow related enhancement of the left internal carotid artery lacerum, cavernous and supraclinoid segments. There is a central defect within the cavernous segment and at the carotid terminus. The right ICA is normal. Anterior cerebral arteries: Normal. Middle cerebral arteries: The right MCA is normal. The left middle cerebral artery is patent proximally. However, there is limited flow related enhancement of the distal branches. Posterior communicating arteries: Present on the left. Posterior cerebral arteries: Normal. Basilar artery: Normal. Vertebral arteries: Codominant normal. Superior cerebellar arteries: Normal. Inferior cerebellar arteries: Normal. IMPRESSION: 1. Acute infarct of the left lentiform nucleus, posterior limb of the internal capsule  and medial left temporal lobe. No hemorrhage or mass effect. 2. Diminished flow related enhancement of the left internal carotid artery at the skull base with suspected acute thrombus. The left middle cerebral artery is normal proximally, but there is minimal flow related enhancement of the distal branches. 3. Chronic ischemic microangiopathy and generalized atrophy. Electronically Signed   By: Deatra Robinson M.D.   On: 05/07/2018 01:58     CBC Recent Labs  Lab 05/06/18 1419  WBC 15.2*  HGB 15.5*  HCT 44.8  PLT 246  MCV 88.4  MCH 30.6  MCHC 34.6  RDW 12.9  LYMPHSABS 0.7  MONOABS 0.9  EOSABS 0.0  BASOSABS 0.0     Chemistries  Recent Labs  Lab 05/06/18 1419  NA 139  K 3.4*  CL 102  CO2 22  GLUCOSE 126*  BUN 21  CREATININE 0.63  CALCIUM 9.2  AST 61*  ALT 28  ALKPHOS 58  BILITOT 2.0*   ------------------------------------------------------------------------------------------------------------------ estimated creatinine clearance is 36.7 mL/min (by C-G formula based on SCr of 0.63 mg/dL). ------------------------------------------------------------------------------------------------------------------ Recent Labs    05/06/18 1419  HGBA1C 5.5   ------------------------------------------------------------------------------------------------------------------ Recent Labs    05/07/18 0430  CHOL 150  HDL 49  LDLCALC 87  TRIG 71  CHOLHDL 3.1   ------------------------------------------------------------------------------------------------------------------ No results for input(s): TSH, T4TOTAL, T3FREE, THYROIDAB in the last 72 hours.  Invalid input(s): FREET3 ------------------------------------------------------------------------------------------------------------------ No results for input(s): VITAMINB12, FOLATE, FERRITIN, TIBC, IRON, RETICCTPCT in the last 72 hours.  Coagulation profile Recent Labs  Lab 05/06/18 1419  INR 1.18    No results for input(s): DDIMER in the last 72 hours.  Cardiac Enzymes Recent Labs  Lab 05/06/18 1419  TROPONINI 0.03*   ------------------------------------------------------------------------------------------------------------------ Invalid input(s): POCBNP    Assessment & Plan   *Acute CVA of left internal capsule -Check MRI of the brain, Carotid dopplers, Echo - Start aspirin .  Statin once patient able to swallow - Lovenox for DVT prophylaxis. - PT/OT/Speech consult as needed per symptoms - Neuro checks every 4 hours for 24 hours. - Consult neurology.  *Uncontrolled hypertension.  Unable to take po hold  medication  *Hypothyroidism.  hold levothyroxine for now  *Acute rhabdomyolysis.    Continue IV fluids.  DVT prophylaxis with Lovenox     Code Status Orders  (From admission, onward)         Start     Ordered   05/06/18 2126  Do not attempt resuscitation (DNR)  Continuous    Question Answer Comment  In the event of cardiac or respiratory ARREST Do not call a "code blue"   In the event of cardiac or respiratory ARREST Do not perform Intubation, CPR, defibrillation or ACLS   In the event of cardiac or respiratory ARREST Use medication by any route, position, wound care, and other measures to relive pain and suffering. May use oxygen, suction and manual treatment of airway obstruction as needed for comfort.      05/06/18 2125        Code Status History    Date Active Date Inactive Code Status Order ID Comments User Context   05/06/2018 1626 05/06/2018 2125 Full Code [id number]  Milagros Loll, MD ED    Advance Directive Documentation     Most Recent Value  Type of Advance Directive  Healthcare Power of Attorney, Living will  Pre-existing out of facility DNR order (yellow form or pink MOST form)  -  "MOST" Form in Place?  -  Consults  neuro DVT Prophylaxis  Lovenox   Lab Results  Component Value Date   PLT 246 05/06/2018     Time Spent in minutes    Greater than 50% of time spent in care coordination and counseling patient regarding the condition and plan of care.   Auburn Bilberry M.D on 05/07/2018 at 1:49 PM  Between 7am to 6pm - Pager - [phone]  After 6pm go to [email or web address] - Social research officer, government  [employer]   Office  [phone]

## 2018-05-07 NOTE — Progress Notes (Signed)
Dr. Allena Katz made aware that patient unable to take her metoprolol and synthroid due to dysphagia.

## 2018-05-07 NOTE — Progress Notes (Signed)
Physical Therapy Evaluation Patient Details Name: Jenny Molina MRN: [id number] DOB: Mar 08, 1924 Today's Date: 05/07/2018   History of Present Illness  84 y.o. female with a history of HTN found down in her independent living facility.  Unclear how long she had been down but suspected for days since son was unable to contact on Monday.  Patient now with R-sided weakness and visual field deficits.  MRI of the brain reviewed and shows an acute infarct involving the left lentiform nucleus, posterior limb of the internal capsule and medial left temporal lobe.  MRA shows possible acute thrombus in the left internal carotid artery. Carotid dopplers show no evidence of hemodynamically significant stenosis. Echocardiogram pending.  Clinical Impression  Pt is a pleasant 84 year old female who was admitted for CVA. Pt is lethargic on arrival, but with cues becomes more alert. Pt has left gaze preference, able to track object past midline with cueing. Pt family educated on sitting on her right side to promote being attention to pt's right side. Pt performs bed mobility with Max +2 physical. Pt had increased participation coming for sit-to-supine, pt able to lift LLE onto bed, needs assist at trunk and RLE. Pt also able to roll from right side onto back with VC. Pt's BP monitored in lying 135/64, and sitting 152/74. Pt demonstrates deficits with cognition, motor planning, mobility, strength, balance, activity tolerance. Pt is not at her baseline. Would benefit from skilled PT to address above deficits and promote optimal return to PLOF.      Follow Up Recommendations SNF    Equipment Recommendations  Rolling walker with 5" wheels    Recommendations for Other Services       Precautions / Restrictions Precautions Precautions: Fall Restrictions Weight Bearing Restrictions: No      Mobility  Bed Mobility Overal bed mobility: Needs Assistance Bed Mobility: Supine to Sit;Sit to Supine     Supine to  sit: Max assist;+2 for physical assistance;HOB elevated Sit to supine: Max assist;+2 for physical assistance   General bed mobility comments: Pt unable to follow commands to participate to come to sit EOB, max A +2 required. Returning to supine pt demonstrated ability to manage LLE onto bed (RLE assisted onto bed) and roll on to back with heavy VC for sequencing. Max A +2 physical to scoot toward HOB.  Transfers                 General transfer comment: Did not assess this date d/t cognitive status and safety concerns.  Ambulation/Gait             General Gait Details: Did not assess this date d/t cognitive status and safety concerns.  Stairs            Wheelchair Mobility    Modified Rankin (Stroke Patients Only)       Balance Overall balance assessment: Needs assistance Sitting-balance support: Bilateral upper extremity supported;Feet supported Sitting balance-Leahy Scale: Poor Sitting balance - Comments: Pt has poor trunk control, requires Max A to maintain upright posturing. Posterolateral R lean noted.                                     Pertinent Vitals/Pain Pain Assessment: Faces Faces Pain Scale: No hurt    Home Living Family/patient expects to be discharged to:: Other (Comment)(Independent living facility [facility]  Prior Function Level of Independence: Independent         Comments: Unable to get information from pt d/t cognitive status. Per family uses RW for ambulation. Unable to determine full PLOF with ADL and IADL at this time.     Hand Dominance        Extremity/Trunk Assessment   Upper Extremity Assessment Upper Extremity Assessment: RUE deficits/detail;LUE deficits/detail RUE Deficits / Details: 0/5(Noted trace activity during hand squeeze once during session) RUE Sensation: decreased light touch;decreased proprioception RUE Coordination: (Unable to assess d/t cognitive  status) LUE Deficits / Details: 2/5 LUE Sensation: WNL LUE Coordination: (Unable to assess d/t cognitive status)    Lower Extremity Assessment Lower Extremity Assessment: RLE deficits/detail;LLE deficits/detail RLE Deficits / Details: 0/5 RLE Sensation: decreased light touch;decreased proprioception RLE Coordination: (Unable to assess d/t cognitive status) LLE Deficits / Details: 3/5 LLE Sensation: WNL LLE Coordination: (Unable to assess d/t cognitive status)       Communication   Communication: Expressive difficulties  Cognition Arousal/Alertness: Lethargic;Awake/alert Behavior During Therapy: Flat affect Overall Cognitive Status: Impaired/Different from baseline Area of Impairment: Orientation;Attention;Following commands;Problem solving                 Orientation Level: Disoriented to;Person;Place;Time;Situation Current Attention Level: Focused   Following Commands: Follows one step commands inconsistently;Follows multi-step commands inconsistently     Problem Solving: Slow processing;Difficulty sequencing;Requires verbal cues;Requires tactile cues;Decreased initiation        General Comments General comments (skin integrity, edema, etc.): Pt able to track object across midline. With heavy VC pt was able to turn head to R two times t/o session.    Exercises Other Exercises Other Exercises: Seated reaching with LUE/weight shifting activities. Max A, tactile, and VC required.   Assessment/Plan    PT Assessment Patient needs continued PT services  PT Problem List Decreased strength;Decreased activity tolerance;Decreased balance;Decreased mobility;Decreased coordination;Decreased cognition;Impaired sensation       PT Treatment Interventions DME instruction;Gait training;Functional mobility training;Therapeutic exercise;Therapeutic activities;Balance training;Neuromuscular re-education;Cognitive remediation;Patient/family education    PT Goals (Current goals  can be found in the Care Plan section)  Acute Rehab PT Goals Patient Stated Goal: Unable to state goal PT Goal Formulation: Patient unable to participate in goal setting Time For Goal Achievement: 05/21/18 Potential to Achieve Goals: Fair    Frequency 7X/week   Barriers to discharge        Co-evaluation               AM-PAC PT "6 Clicks" Daily Activity  Outcome Measure Difficulty turning over in bed (including adjusting bedclothes, sheets and blankets)?: Unable Difficulty moving from lying on back to sitting on the side of the bed? : Unable Difficulty sitting down on and standing up from a chair with arms (e.g., wheelchair, bedside commode, etc,.)?: Unable Help needed moving to and from a bed to chair (including a wheelchair)?: Total Help needed walking in hospital room?: Total Help needed climbing 3-5 steps with a railing? : Total 6 Click Score: 6    End of Session   Activity Tolerance: Patient limited by fatigue;Patient limited by lethargy Patient left: in bed;with call bell/phone within reach;with bed alarm set;with family/visitor present Nurse Communication: Mobility status PT Visit Diagnosis: Hemiplegia and hemiparesis;Other symptoms and signs involving the nervous system (R29.898);Muscle weakness (generalized) (M62.81) Hemiplegia - Right/Left: Right Hemiplegia - caused by: Cerebral infarction    Time: 4098-1191 PT Time Calculation (min) (ACUTE ONLY): 30 min   Charges:  Arvilla Meres, SPT 05/07/2018, 3:09 PM

## 2018-05-07 NOTE — Progress Notes (Signed)
OT Cancellation Note  Patient Details Name: Jenny Molina MRN: [id number] DOB: 1923-11-03   Cancelled Treatment:    Reason Eval/Treat Not Completed: Patient at procedure or test/ unavailable. Order received, chart reviewed. Pt out of room for testing on 2nd attempt. Will re-attempt OT evaluation at later date/time as pt is available and medically appropriate.  Richrd Prime, MPH, MS, OTR/L ascom [phone] 05/07/18, 2:28 PM

## 2018-05-07 NOTE — Progress Notes (Signed)
*  PRELIMINARY RESULTS* Echocardiogram 2D Echocardiogram has been performed.  Joanette Gula Tacarra Justo 05/07/2018, 10:39 AM

## 2018-05-07 NOTE — Consult Note (Signed)
Referring Physician: Allena Katz    Chief Complaint: Right sided weakness  HPI: Jenny Molina is an 84 y.o. female with a history of HTN who is unable to provide any history due to her mental status.  Family able to provide minimal history.  Per son was unable to reach mother on Monday.  Tried again yesterday and unable to reach.  Patient lives in an independent living facility and was found on the floor by staff.  Right side was noted to be weak, had difficulty speaking and was lethargic.  Patient brought in for evaluation by EMS.  Per chart LKW was at least three days prior to presentation.    Date last known well: Unable to determine Time last known well: Unable to determine tPA Given: No: Unable to determine LKW  Past Medical History:  Diagnosis Date  . HTN (hypertension)   . Hypothyroidism     History reviewed. No pertinent surgical history.  Family History  Problem Relation Age of Onset  . Hypertension Mother   . CAD Father    Social History:  reports that she has never smoked. She has never used smokeless tobacco. She reports that she drank alcohol. She reports that she has current or past drug history.  Allergies:  Allergies  Allergen Reactions  . Benicar [Olmesartan]   . Ciprofloxacin   . Hydralazine     Feels funny  . Hydrochlorothiazide   . Penicillins   . Sulfa Antibiotics     Sulfa drugs    Medications:  I have reviewed the patient's current medications. Prior to Admission:  Medications Prior to Admission  Medication Sig Dispense Refill Last Dose  . ALPRAZolam (XANAX) 0.5 MG tablet Take 1 tablet by mouth every 8 (eight) hours.   Unknown at Unknown  . amLODipine (NORVASC) 5 MG tablet Take 1 tablet by mouth daily.   Unknown at Unknown  . furosemide (LASIX) 20 MG tablet Take 1 tablet by mouth daily as needed.    prn at prn  . levothyroxine (SYNTHROID, LEVOTHROID) 50 MCG tablet Take 1 tablet by mouth daily before breakfast.   Unknown at Unknown  . metoprolol  (TOPROL-XL) 200 MG 24 hr tablet Take 1 tablet by mouth daily.   Unknown at Unknown   Scheduled: . aspirin  300 mg Rectal Daily   Or  . aspirin  325 mg Oral Daily  . enoxaparin (LOVENOX) injection  40 mg Subcutaneous Q24H  . levothyroxine  50 mcg Oral QAC breakfast    ROS: Unable to provide due to deficits  Physical Examination: Blood pressure (!) 158/66, pulse 66, temperature 98.6 F (37 C), temperature source Oral, resp. rate 18, height 5\' 5"  (1.651 m), weight 51.8 kg, SpO2 95 %.  HEENT-  Normocephalic, no lesions, without obvious abnormality.  Normal external eye and conjunctiva.  Normal TM's bilaterally.  Normal auditory canals and external ears. Normal external nose, mucus membranes and septum.  Normal pharynx. Cardiovascular- S1, S2 normal, pulses palpable throughout   Lungs- chest clear, no wheezing, rales, normal symmetric air entry Abdomen- soft, non-tender; bowel sounds normal; no masses,  no organomegaly Extremities- no edema Lymph-no adenopathy palpable Musculoskeletal-no joint tenderness, deformity or swelling Skin-warm and dry, no hyperpigmentation, vitiligo, or suspicious lesions  Neurological Examination   Mental Status: Lethargic.  Does not follow commands.  Speech mumbled and unintelligible. Cranial Nerves: II: Discs flat bilaterally; Does not blink to confrontation from the right, pupils equal, round, reactive to light and accommodation III,IV, VI: ptosis not present, left  gaze preference V,VII: right facial droop VIII: hearing normal bilaterally IX,X: gag reflex reduced XI: unable to test due to cooperation XII: unable to test due to cooperation Motor: 0/5 movement on the right upper and lower extremities.  Patient moves left upper extremity against gravity spontaneously.  Minimal movement noted of the LLE Sensory: Patient appreciates painful stimuli bilaterally Deep Tendon Reflexes: 2+ and symmetric with absent AJ's bilaterally Plantars: Right:  upgoing   Left: upgoing Cerebellar: unable to test due to cooperationt Gait: not tested due to safety concerns    Laboratory Studies:  Basic Metabolic Panel: Recent Labs  Lab 05/06/18 1419  NA 139  K 3.4*  CL 102  CO2 22  GLUCOSE 126*  BUN 21  CREATININE 0.63  CALCIUM 9.2    Liver Function Tests: Recent Labs  Lab 05/06/18 1419  AST 61*  ALT 28  ALKPHOS 58  BILITOT 2.0*  PROT 7.1  ALBUMIN 4.1   No results for input(s): LIPASE, AMYLASE in the last 168 hours. No results for input(s): AMMONIA in the last 168 hours.  CBC: Recent Labs  Lab 05/06/18 1419  WBC 15.2*  NEUTROABS 13.6*  HGB 15.5*  HCT 44.8  MCV 88.4  PLT 246    Cardiac Enzymes: Recent Labs  Lab 05/06/18 1419 05/07/18 0430  CKTOTAL 1,678* 539*  TROPONINI 0.03*  --     BNP: Invalid input(s): POCBNP  CBG: Recent Labs  Lab 05/07/18 0733 05/07/18 1152  GLUCAP 80 75    Microbiology: No results found for this or any previous visit.  Coagulation Studies: Recent Labs    05/06/18 1419  LABPROT 14.9  INR 1.18    Urinalysis:  Recent Labs  Lab 05/06/18 1435  COLORURINE YELLOW*  LABSPEC 1.016  PHURINE 6.0  GLUCOSEU 50*  HGBUR LARGE*  BILIRUBINUR NEGATIVE  KETONESUR 20*  PROTEINUR 100*  NITRITE NEGATIVE  LEUKOCYTESUR NEGATIVE    Lipid Panel:    Component Value Date/Time   CHOL 150 05/07/2018 0430   TRIG 71 05/07/2018 0430   HDL 49 05/07/2018 0430   CHOLHDL 3.1 05/07/2018 0430   VLDL 14 05/07/2018 0430   LDLCALC 87 05/07/2018 0430    HgbA1C:  Lab Results  Component Value Date   HGBA1C 5.5 05/06/2018    Urine Drug Screen:      Component Value Date/Time   LABOPIA NONE DETECTED 05/06/2018 1435   COCAINSCRNUR NONE DETECTED 05/06/2018 1435   LABBENZ POSITIVE (A) 05/06/2018 1435   AMPHETMU NONE DETECTED 05/06/2018 1435   THCU NONE DETECTED 05/06/2018 1435   LABBARB NONE DETECTED 05/06/2018 1435    Alcohol Level:  Recent Labs  Lab 05/06/18 1419  ETH <10     Other results: EKG: sinus rhythm at 69 bpm.  Imaging: Ct Head Wo Contrast  Result Date: 05/06/2018 CLINICAL DATA:  84 year old female fell and presumed to be on floor for 3 days per neighbor. Right-sided weakness. Initial encounter. EXAM: CT HEAD WITHOUT CONTRAST CT CERVICAL SPINE WITHOUT CONTRAST TECHNIQUE: Multidetector CT imaging of the head and cervical spine was performed following the standard protocol without intravenous contrast. Multiplanar CT image reconstructions of the cervical spine were also generated. COMPARISON:  12/25/2013 CT. FINDINGS: CT HEAD FINDINGS Brain: Age-indeterminate infarct versus prominent chronic microvascular changes genu of the left internal capsule. Otherwise no CT evidence of large acute infarct. No intracranial hemorrhage. Prominent chronic microvascular changes. Global atrophy. No intracranial mass lesion noted on this unenhanced exam. Vascular: No hyperdense vessel. Skull: No skull fracture. Sinuses/Orbits: Post lens  replacement. No acute orbital abnormality. Mucosal thickening ethmoid sinus air cells. Other: Mastoid air cells and middle ear cavities are clear. CT CERVICAL SPINE FINDINGS Alignment: Mild rotation head and C1 upon C2. Skull base and vertebrae: No cervical spine fracture. Soft tissues and spinal canal: No abnormal prevertebral soft tissue swelling. Disc levels: Cervical spondylotic changes most notable C4-5 through C6-7. No high-grade spinal stenosis. Upper chest: No worrisome abnormality. Other: No worrisome neck mass.  Carotid bifurcation calcifications. IMPRESSION: CT HEAD 1. Age-indeterminate infarct versus prominent chronic microvascular changes genu of the left internal capsule. Otherwise no CT evidence of large acute infarct. 2. No skull fracture or intracranial hemorrhage. 3. Prominent chronic microvascular changes. 4. Global atrophy. CT CERVICAL SPINE 1. Mild rotation head/C1 upon C2 may be related to head position. 2. No cervical spine  fracture or abnormal prevertebral soft tissue swelling. 3. Spondylotic changes most notable C4-5 through C6-7. No high-grade spinal stenosis. Electronically Signed   By: Lacy Duverney M.D.   On: 05/06/2018 15:27   Ct Cervical Spine Wo Contrast  Result Date: 05/06/2018 CLINICAL DATA:  84 year old female fell and presumed to be on floor for 3 days per neighbor. Right-sided weakness. Initial encounter. EXAM: CT HEAD WITHOUT CONTRAST CT CERVICAL SPINE WITHOUT CONTRAST TECHNIQUE: Multidetector CT imaging of the head and cervical spine was performed following the standard protocol without intravenous contrast. Multiplanar CT image reconstructions of the cervical spine were also generated. COMPARISON:  12/25/2013 CT. FINDINGS: CT HEAD FINDINGS Brain: Age-indeterminate infarct versus prominent chronic microvascular changes genu of the left internal capsule. Otherwise no CT evidence of large acute infarct. No intracranial hemorrhage. Prominent chronic microvascular changes. Global atrophy. No intracranial mass lesion noted on this unenhanced exam. Vascular: No hyperdense vessel. Skull: No skull fracture. Sinuses/Orbits: Post lens replacement. No acute orbital abnormality. Mucosal thickening ethmoid sinus air cells. Other: Mastoid air cells and middle ear cavities are clear. CT CERVICAL SPINE FINDINGS Alignment: Mild rotation head and C1 upon C2. Skull base and vertebrae: No cervical spine fracture. Soft tissues and spinal canal: No abnormal prevertebral soft tissue swelling. Disc levels: Cervical spondylotic changes most notable C4-5 through C6-7. No high-grade spinal stenosis. Upper chest: No worrisome abnormality. Other: No worrisome neck mass.  Carotid bifurcation calcifications. IMPRESSION: CT HEAD 1. Age-indeterminate infarct versus prominent chronic microvascular changes genu of the left internal capsule. Otherwise no CT evidence of large acute infarct. 2. No skull fracture or intracranial hemorrhage. 3. Prominent  chronic microvascular changes. 4. Global atrophy. CT CERVICAL SPINE 1. Mild rotation head/C1 upon C2 may be related to head position. 2. No cervical spine fracture or abnormal prevertebral soft tissue swelling. 3. Spondylotic changes most notable C4-5 through C6-7. No high-grade spinal stenosis. Electronically Signed   By: Lacy Duverney M.D.   On: 05/06/2018 15:27   Ct Pelvis Wo Contrast  Result Date: 05/06/2018 CLINICAL DATA:  Patient status post fall at some point over the past 3 days. Pelvic pain. Initial encounter. EXAM: CT PELVIS WITHOUT CONTRAST TECHNIQUE: Multidetector CT imaging of the pelvis was performed following the standard protocol without intravenous contrast. COMPARISON:  CT abdomen and pelvis 03/24/2014. FINDINGS: Urinary Tract: The urinary bladder is completely decompressed with a Foley catheter in place. Bowel: Sigmoid diverticulosis is noted. Imaged bowel loops are otherwise unremarkable. Vascular/Lymphatic: Atherosclerotic vascular disease is seen. No lymphadenopathy. Reproductive:  No mass or other significant abnormality Other:  No fluid collection. Musculoskeletal: The hips are located. No fracture is identified. Small bone islands in the right sacrum and left ilium  are unchanged. Mild right hip joint space narrowing is identified. Mild degenerative change is also seen at the symphysis pubis. There is some facet arthropathy and a disc bulge at L5-S1. IMPRESSION: Negative for fracture.  No acute abnormality. Lower lumbar spondylosis. Mild degenerative change right hip and symphysis pubis also noted. Atherosclerosis. Diverticulosis. Electronically Signed   By: Drusilla Kanner M.D.   On: 05/06/2018 15:15   Mr Brain Wo Contrast  Result Date: 05/07/2018 CLINICAL DATA:  Ataxia EXAM: MRI HEAD WITHOUT CONTRAST MRA HEAD WITHOUT CONTRAST TECHNIQUE: Multiplanar, multiecho pulse sequences of the brain and surrounding structures were obtained without intravenous contrast. Angiographic images of  the head were obtained using MRA technique without contrast. COMPARISON:  Head CT 05/06/2018 FINDINGS: MRI HEAD FINDINGS BRAIN: There is abnormal diffusion restriction of the left lentiform nucleus, posterior limb of the left internal capsule and medial left temporal lobe. The midline structures are normal. Early confluent hyperintense T2-weighted signal of the periventricular and deep white matter, most commonly due to chronic ischemic microangiopathy. Generalized atrophy without lobar predilection. Single focus of chronic microhemorrhage in the left frontal lobe. SKULL AND UPPER CERVICAL SPINE: The visualized skull base, calvarium, upper cervical spine and extracranial soft tissues are normal. SINUSES/ORBITS: No fluid levels or advanced mucosal thickening. No mastoid or middle ear effusion. The orbits are normal. MRA HEAD FINDINGS Intracranial internal carotid arteries: There is diminished flow related enhancement of the left internal carotid artery lacerum, cavernous and supraclinoid segments. There is a central defect within the cavernous segment and at the carotid terminus. The right ICA is normal. Anterior cerebral arteries: Normal. Middle cerebral arteries: The right MCA is normal. The left middle cerebral artery is patent proximally. However, there is limited flow related enhancement of the distal branches. Posterior communicating arteries: Present on the left. Posterior cerebral arteries: Normal. Basilar artery: Normal. Vertebral arteries: Codominant normal. Superior cerebellar arteries: Normal. Inferior cerebellar arteries: Normal. IMPRESSION: 1. 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 No hemorrhage or mass effect. 2. Diminished flow related enhancement of the left internal carotid artery at the skull base with suspected acute thrombus. The left middle cerebral artery is normal proximally, but there is minimal flow related  enhancement of the distal branches. 3. Chronic ischemic microangiopathy and generalized atrophy. Electronically Signed   By: Deatra Robinson M.D.   On: 05/07/2018 01:58   US Carotid Bilateral (at [facility] And [facility] Only)  Result Date: 05/07/2018 CLINICAL DATA:  84 year old female with symptoms of transient ischemic attack EXAM: BILATERAL CAROTID DUPLEX ULTRASOUND TECHNIQUE: Wallace Cullens scale imaging, color Doppler and duplex ultrasound were performed of bilateral carotid and vertebral arteries in the neck. COMPARISON:  84 year old female with symptoms of transient ischemic attack FINDINGS: Criteria: Quantification of carotid stenosis is based on velocity parameters that correlate the residual internal carotid diameter with NASCET-based stenosis levels, using the diameter of the distal internal carotid lumen as the denominator for stenosis measurement. The following velocity measurements were obtained: RIGHT ICA: 74/18 cm/sec CCA: 108/16 cm/sec SYSTOLIC ICA/CCA RATIO:  0.7 ECA:  132 cm/sec LEFT ICA: 41/4 cm/sec CCA: 9 9/7 cm/sec SYSTOLIC ICA/CCA RATIO:  0.4 ECA:  137 cm/sec RIGHT CAROTID ARTERY: No significant atherosclerotic plaque or evidence of stenosis in the internal carotid artery. RIGHT VERTEBRAL ARTERY:  Patent with normal antegrade flow. LEFT CAROTID ARTERY: Mild focal heterogeneous atherosclerotic plaque in the proximal internal carotid artery. By peak systolic velocity criteria, the estimated stenosis remains less than 50%.  LEFT VERTEBRAL ARTERY:  Patent with normal antegrade flow. IMPRESSION: 1. Mild (1-49%) stenosis proximal left internal carotid artery secondary to focal heterogeneous atherosclerotic plaque. 2. No significant atherosclerotic plaque or evidence of stenosis in the right internal carotid artery. 3. Vertebral arteries are patent with normal antegrade flow. Signed, Sterling Big, MD, RPVI [facility] Electronically Signed   By: Malachy Moan M.D.   On: 05/07/2018 10:48   Dg Chest Portable 1 View  Result Date: 05/06/2018 CLINICAL DATA:  Right-sided weakness, fall EXAM: PORTABLE CHEST 1 VIEW COMPARISON:  03/24/2014, CT 05/06/2014 FINDINGS: The patient's hand obscures the left lower chest. Small right-sided pleural effusion. Enlarged cardiomediastinal silhouette with vascular congestion and hazy atelectasis or edema at the bases. No pneumothorax. IMPRESSION: 1. Cardiomegaly with vascular congestion and hazy bibasilar atelectasis or mild edema. 2. Tiny right pleural effusion Electronically Signed   By: Jasmine Pang M.D.   On: 05/06/2018 14:58   Mr Maxine Glenn Head/brain IR Cm  Result Date: 05/07/2018 CLINICAL DATA:  Ataxia EXAM: MRI HEAD WITHOUT CONTRAST MRA HEAD WITHOUT CONTRAST TECHNIQUE: Multiplanar, multiecho pulse sequences of the brain and surrounding structures were obtained without intravenous contrast. Angiographic images of the head were obtained using MRA technique without contrast. COMPARISON:  Head CT 05/06/2018 FINDINGS: MRI HEAD FINDINGS BRAIN: There is abnormal diffusion restriction of the left lentiform nucleus, posterior limb of the left internal capsule and medial left temporal lobe. The midline structures are normal. Early confluent hyperintense T2-weighted signal of the periventricular and deep white matter, most commonly due to chronic ischemic microangiopathy. Generalized atrophy without lobar predilection. Single focus of chronic microhemorrhage in the left frontal lobe. SKULL AND UPPER CERVICAL SPINE: The visualized skull base, calvarium, upper cervical spine and extracranial soft tissues are normal. SINUSES/ORBITS: No fluid levels or advanced mucosal thickening. No mastoid or middle ear effusion. The orbits are normal. MRA HEAD FINDINGS Intracranial internal carotid arteries: There is diminished flow related enhancement of the left internal carotid artery lacerum, cavernous and supraclinoid segments. There is a  central defect within the cavernous segment and at the carotid terminus. The right ICA is normal. Anterior cerebral arteries: Normal. Middle cerebral arteries: The right MCA is normal. The left middle cerebral artery is patent proximally. However, there is limited flow related enhancement of the distal branches. Posterior communicating arteries: Present on the left. Posterior cerebral arteries: Normal. Basilar artery: Normal. Vertebral arteries: Codominant normal. Superior cerebellar arteries: Normal. Inferior cerebellar arteries: Normal. IMPRESSION: 1. 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 No hemorrhage or mass effect. 2. Diminished flow related enhancement of the left internal carotid artery at the skull base with suspected acute thrombus. The left middle cerebral artery is normal proximally, but there is minimal flow related enhancement of the distal branches. 3. Chronic ischemic microangiopathy and generalized atrophy. Electronically Signed   By: Deatra Robinson M.D.   On: 05/07/2018 01:58    Assessment: 84 y.o. female with a history of hypertension found down in her independent living facility.  Unclear how long she had been down but suspected for days since son was unable to contact on Monday.  Patient now with right sided weakness and visual field deficits.  MRI of the brain reviewed and shows an acute infarct involving the left lentiform nucleus, posterior limb of the internal capsule and medial left temporal lobe.  MRA shows possible acute thrombus in the left internal carotid artery.  Concern is for embolic etiology.  Patient on no antiplatelet therapy prior to admission. Carotid dopplers show no evidence of hemodynamically significant stenosis.  Echocardiogram pending.  A1c 5.5, LDL 87.  Stroke Risk Factors - hypertension  Plan: 1. CTA of neck 2. Statin for lipid management with target LDL<70 once patient able to take  po 3. PT consult, OT consult, Speech consult 4. Echocardiogram results pending 5. Prophylactic therapy-Agree with ASA 300 mg rectally every day 7. NPO until RN stroke swallow screen 8. Telemetry monitoring 9. Frequent neuro checks 10. Agree with hydration for rhabdo 11. EEG 12. Liberal BP management  Thana Farr, MD Neurology [phone] 05/07/2018, 12:33 PM

## 2018-05-07 NOTE — Progress Notes (Signed)
eeg completed ° °

## 2018-05-07 NOTE — Procedures (Signed)
ELECTROENCEPHALOGRAM REPORT   Patient: Jenny Molina       Room #: 116A-AA EEG No. ID: [id number] Age: 84 y.o.        Sex: female Referring Physician: Allena Katz Report Date:  05/07/2018        Interpreting Physician: Thana Farr  History: Jenny Molina is an 84 y.o. female s/p left mid-temporal infarct with lethargy  Medications:  ASA, Insulin, Synthroid  Conditions of Recording:  This is a 21 channel routine scalp EEG performed with bipolar and monopolar montages arranged in accordance to the international 10/20 system of electrode placement. One channel was dedicated to EKG recording.  The patient is in the lethargic state.  Description:  The background activity is asymmetric.  Over the left hemiphere the amplitude is somewhat attenuated.  The background is slow and poorly organized with a mixture of theta and delta activity noted.  Over the right hemisphere the background activity consists of more alpha activity and often a posterior background rhythm of 9 Hz alpha is noted.  Also noted over the right hemisphere are frequent sharp transients with phase reversal at T4.  Hyperventilation was not performed.  Intermittent photic stimulation was performed but failed to illicit any change in the tracing.     IMPRESSION: This is an abnormal electroencephalogram secondary to an asymmetric background with frequent sharp transients over the right mid-temporal region with phase reversal at T4.     Thana Farr, MD Neurology [phone] 05/07/2018, 4:34 PM

## 2018-05-08 LAB — CBC WITH DIFFERENTIAL/PLATELET
Abs Immature Granulocytes: 0.03 10*3/uL (ref 0.00–0.07)
BASOS ABS: 0 10*3/uL (ref 0.0–0.1)
Basophils Relative: 0 %
EOS PCT: 0 %
Eosinophils Absolute: 0 10*3/uL (ref 0.0–0.5)
HCT: 37.9 % (ref 36.0–46.0)
HEMOGLOBIN: 12.8 g/dL (ref 12.0–15.0)
Immature Granulocytes: 0 %
LYMPHS ABS: 1.1 10*3/uL (ref 0.7–4.0)
LYMPHS PCT: 12 %
MCH: 30.4 pg (ref 26.0–34.0)
MCHC: 33.8 g/dL (ref 30.0–36.0)
MCV: 90 fL (ref 80.0–100.0)
MONO ABS: 0.7 10*3/uL (ref 0.1–1.0)
MONOS PCT: 8 %
NRBC: 0 % (ref 0.0–0.2)
Neutro Abs: 6.9 10*3/uL (ref 1.7–7.7)
Neutrophils Relative %: 80 %
PLATELETS: 192 10*3/uL (ref 150–400)
RBC: 4.21 MIL/uL (ref 3.87–5.11)
RDW: 13 % (ref 11.5–15.5)
WBC: 8.7 10*3/uL (ref 4.0–10.5)

## 2018-05-08 LAB — BASIC METABOLIC PANEL
Anion gap: 13 (ref 5–15)
BUN: 15 mg/dL (ref 8–23)
CALCIUM: 7.8 mg/dL — AB (ref 8.9–10.3)
CO2: 21 mmol/L — ABNORMAL LOW (ref 22–32)
Chloride: 108 mmol/L (ref 98–111)
Creatinine, Ser: 0.55 mg/dL (ref 0.44–1.00)
GFR calc Af Amer: >60 mL/min (ref >60)
GLUCOSE: 67 mg/dL — AB (ref 70–99)
POTASSIUM: 2.8 mmol/L — AB (ref 3.5–5.1)
SODIUM: 142 mmol/L (ref 135–145)

## 2018-05-08 LAB — PHOSPHORUS: Phosphorus: 2.5 mg/dL (ref 2.5–4.6)

## 2018-05-08 LAB — GLUCOSE, CAPILLARY
Glucose-Capillary: 155 mg/dL — ABNORMAL HIGH (ref 70–99)
Glucose-Capillary: 69 mg/dL — ABNORMAL LOW (ref 70–99)
Glucose-Capillary: 71 mg/dL (ref 70–99)

## 2018-05-08 LAB — TROPONIN I: Troponin I: 0.04 ng/mL (ref <0.03)

## 2018-05-08 LAB — MAGNESIUM: MAGNESIUM: 1.8 mg/dL (ref 1.7–2.4)

## 2018-05-08 LAB — CK: Total CK: 213 U/L (ref 38–234)

## 2018-05-08 MED ORDER — POTASSIUM CHLORIDE 10 MEQ/100ML IV SOLN
10.0000 meq | INTRAVENOUS | Status: AC
  Administered 2018-05-08 (×2): 10 meq via INTRAVENOUS
  Filled 2018-05-08 (×2): qty 100

## 2018-05-08 MED ORDER — DEXTROSE 50 % IV SOLN
25.0000 mL | Freq: Once | INTRAVENOUS | Status: AC
  Administered 2018-05-08: 25 mL via INTRAVENOUS

## 2018-05-08 MED ORDER — KCL IN DEXTROSE-NACL 40-5-0.9 MEQ/L-%-% IV SOLN
INTRAVENOUS | Status: DC
  Administered 2018-05-08 – 2018-05-10 (×5): via INTRAVENOUS
  Filled 2018-05-08 (×7): qty 1000

## 2018-05-08 MED ORDER — DEXTROSE-NACL 5-0.9 % IV SOLN
INTRAVENOUS | Status: DC
  Administered 2018-05-08: 06:00:00 via INTRAVENOUS

## 2018-05-08 MED ORDER — DEXTROSE 50 % IV SOLN
INTRAVENOUS | Status: AC
  Administered 2018-05-08: 06:00:00 25 mL via INTRAVENOUS
  Filled 2018-05-08: qty 50

## 2018-05-08 NOTE — Progress Notes (Signed)
[employer] - [place] at [facility]                                                                                                                                                                                  Patient Demographics   Jenny Molina, is a 84 y.o. female, DOB - [birth year], ZOX:[id number]  Admit date - 05/06/2018   Admitting Physician Milagros Loll, MD  Outpatient Primary MD for the patient is Judithann Sheen Duane Lope, MD   LOS - 2  Subjective: Patient still unable to communicate able to follow some commands family at bedside    Review of Systems:   CONSTITUTIONAL: unable to provide.    Vitals:   Vitals:   05/07/18 1616 05/07/18 1934 05/07/18 2343 05/08/18 0406  BP: (!) 148/69 (!) 137/49 136/69 128/68  Pulse: 74 72 77 89  Resp: 18 16 20 16   Temp: 98.3 F (36.8 C) (!) 97.1 F (36.2 C) 98.3 F (36.8 C) 97.7 F (36.5 C)  TempSrc: Oral  Oral Oral  SpO2: 93% 96% 93% 92%  Weight:      Height:        Wt Readings from Last 3 Encounters:  05/06/18 51.8 kg  05/07/18 51.7 kg     Intake/Output Summary (Last 24 hours) at 05/08/2018 1313 Last data filed at 05/08/2018 1223 Gross per 24 hour  Intake 2488.9 ml  Output 2700 ml  Net -211.1 ml    Physical Exam:   GENERAL: Pleasant-appearing in no apparent distress.  HEAD, EYES, EARS, NOSE AND THROAT: Atraumatic, normocephalic. Extraocular muscles are intact. Pupils equal and reactive to light. Sclerae anicteric. No conjunctival injection. No oro-pharyngeal erythema.  NECK: Supple. There is no jugular venous distention. No bruits, no lymphadenopathy, no thyromegaly.  HEART: Regular rate and rhythm,. No murmurs, no rubs, no clicks.  LUNGS: Clear to auscultation bilaterally. No rales or rhonchi. No wheezes.  ABDOMEN: Soft, flat, nontender, nondistended. Has good bowel sounds. No hepatosplenomegaly appreciated.  EXTREMITIES: No evidence of any cyanosis, clubbing, or peripheral edema.  +2 pedal and radial  pulses bilaterally.  NEUROLOGIC: right upper unable to move follows some command not oriented SKIN: Moist and warm with no rashes appreciated.  Psych: Not anxious, depressed LN: No inguinal LN enlargement    Antibiotics   Anti-infectives (From admission, onward)   None      Medications   Scheduled Meds: . aspirin  300 mg Rectal Daily   Or  . aspirin  325 mg Oral Daily  . enoxaparin (LOVENOX) injection  40 mg Subcutaneous Q24H  . levothyroxine  50 mcg Oral QAC breakfast   Continuous Infusions: . dextrose 5 % and 0.9 % NaCl  with KCl 40 mEq/L Stopped (05/08/18 1223)  . lacosamide (VIMPAT) IV Stopped (05/07/18 1836)  . potassium chloride 10 mEq (05/08/18 1223)   PRN Meds:.acetaminophen **OR** acetaminophen (TYLENOL) oral liquid 160 mg/5 mL **OR** acetaminophen, hydrALAZINE, LORazepam   Data Review:   Micro Results No results found for this or any previous visit (from the past 240 hour(s)).  Radiology Reports Ct Head Wo Contrast  Result Date: 05/06/2018 CLINICAL DATA:  84 year old female fell and presumed to be on floor for 3 days per neighbor. Right-sided weakness. Initial encounter. EXAM: CT HEAD WITHOUT CONTRAST CT CERVICAL SPINE WITHOUT CONTRAST TECHNIQUE: Multidetector CT imaging of the head and cervical spine was performed following the standard protocol without intravenous contrast. Multiplanar CT image reconstructions of the cervical spine were also generated. COMPARISON:  12/25/2013 CT. FINDINGS: CT HEAD FINDINGS Brain: Age-indeterminate infarct versus prominent chronic microvascular changes genu of the left internal capsule. Otherwise no CT evidence of large acute infarct. No intracranial hemorrhage. Prominent chronic microvascular changes. Global atrophy. No intracranial mass lesion noted on this unenhanced exam. Vascular: No hyperdense vessel. Skull: No skull fracture. Sinuses/Orbits: Post lens replacement. No acute orbital abnormality. Mucosal thickening ethmoid sinus  air cells. Other: Mastoid air cells and middle ear cavities are clear. CT CERVICAL SPINE FINDINGS Alignment: Mild rotation head and C1 upon C2. Skull base and vertebrae: No cervical spine fracture. Soft tissues and spinal canal: No abnormal prevertebral soft tissue swelling. Disc levels: Cervical spondylotic changes most notable C4-5 through C6-7. No high-grade spinal stenosis. Upper chest: No worrisome abnormality. Other: No worrisome neck mass.  Carotid bifurcation calcifications. IMPRESSION: CT HEAD 1. Age-indeterminate infarct versus prominent chronic microvascular changes genu of the left internal capsule. Otherwise no CT evidence of large acute infarct. 2. No skull fracture or intracranial hemorrhage. 3. Prominent chronic microvascular changes. 4. Global atrophy. CT CERVICAL SPINE 1. Mild rotation head/C1 upon C2 may be related to head position. 2. No cervical spine fracture or abnormal prevertebral soft tissue swelling. 3. Spondylotic changes most notable C4-5 through C6-7. No high-grade spinal stenosis. Electronically Signed   By: Lacy Duverney M.D.   On: 05/06/2018 15:27   Ct Angio Neck W Or Wo Contrast  Result Date: 05/07/2018 CLINICAL DATA:  84 y/o  F; follow-up of stroke. EXAM: CT ANGIOGRAPHY NECK TECHNIQUE: Multidetector CT imaging of the neck was performed using the standard protocol during bolus administration of intravenous contrast. Multiplanar CT image reconstructions and MIPs were obtained to evaluate the vascular anatomy. Carotid stenosis measurements (when applicable) are obtained utilizing NASCET criteria, using the distal internal carotid diameter as the denominator. CONTRAST:  75mL OMNIPAQUE IOHEXOL 350 MG/ML SOLN COMPARISON:  05/07/2018 carotid ultrasound. 05/07/2018 MRI of the head and MRA of the head. FINDINGS: Aortic arch: Common origin of right brachiocephalic and left common carotid arteries. Imaged portion shows no evidence of aneurysm or dissection. No significant stenosis of the  major arch vessel origins. Moderate calcific atherosclerosis. Right carotid system: No evidence of dissection, stenosis (50% or greater) or occlusion. Left carotid system: No evidence of dissection, stenosis (50% or greater) or occlusion in the neck. Non stenotic mild calcified plaque of the carotid bifurcation. Thrombotic occlusion of the left internal carotid artery starting from the distal cavernous segment to terminus, circle-of-Willis not within the field of view (series 8, image 71). Vertebral arteries: Codominant. No evidence of dissection, stenosis (50% or greater) or occlusion. Skeleton: Mild-to-moderate cervical spondylosis with multilevel disc and facet degenerative changes. No high-grade bony canal stenosis. Other neck: Negative. Upper chest:  Biapical pleuroparenchymal scarring with calcification. Small right pleural effusion. IMPRESSION: 1. 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 Visible segments of the ICA are stable from the prior MRA of the head given differences in technique. 2. Patent carotid and vertebral arteries of the neck. No evidence of dissection, hemodynamically significant stenosis by NASCET criteria, or occlusion in the neck. 3. Small right pleural effusion. These results will be called to the ordering clinician or representative by the Radiologist Assistant, and communication documented in the PACS or zVision Dashboard. Electronically Signed   By: Mitzi Hansen M.D.   On: 05/07/2018 13:56   Ct Cervical Spine Wo Contrast  Result Date: 05/06/2018 CLINICAL DATA:  84 year old female fell and presumed to be on floor for 3 days per neighbor. Right-sided weakness. Initial encounter. EXAM: CT HEAD WITHOUT CONTRAST CT CERVICAL SPINE WITHOUT CONTRAST TECHNIQUE: Multidetector CT imaging of the head and cervical spine was performed following the standard protocol without intravenous  contrast. Multiplanar CT image reconstructions of the cervical spine were also generated. COMPARISON:  12/25/2013 CT. FINDINGS: CT HEAD FINDINGS Brain: Age-indeterminate infarct versus prominent chronic microvascular changes genu of the left internal capsule. Otherwise no CT evidence of large acute infarct. No intracranial hemorrhage. Prominent chronic microvascular changes. Global atrophy. No intracranial mass lesion noted on this unenhanced exam. Vascular: No hyperdense vessel. Skull: No skull fracture. Sinuses/Orbits: Post lens replacement. No acute orbital abnormality. Mucosal thickening ethmoid sinus air cells. Other: Mastoid air cells and middle ear cavities are clear. CT CERVICAL SPINE FINDINGS Alignment: Mild rotation head and C1 upon C2. Skull base and vertebrae: No cervical spine fracture. Soft tissues and spinal canal: No abnormal prevertebral soft tissue swelling. Disc levels: Cervical spondylotic changes most notable C4-5 through C6-7. No high-grade spinal stenosis. Upper chest: No worrisome abnormality. Other: No worrisome neck mass.  Carotid bifurcation calcifications. IMPRESSION: CT HEAD 1. Age-indeterminate infarct versus prominent chronic microvascular changes genu of the left internal capsule. Otherwise no CT evidence of large acute infarct. 2. No skull fracture or intracranial hemorrhage. 3. Prominent chronic microvascular changes. 4. Global atrophy. CT CERVICAL SPINE 1. Mild rotation head/C1 upon C2 may be related to head position. 2. No cervical spine fracture or abnormal prevertebral soft tissue swelling. 3. Spondylotic changes most notable C4-5 through C6-7. No high-grade spinal stenosis. Electronically Signed   By: Lacy Duverney M.D.   On: 05/06/2018 15:27   Ct Pelvis Wo Contrast  Result Date: 05/06/2018 CLINICAL DATA:  Patient status post fall at some point over the past 3 days. Pelvic pain. Initial encounter. EXAM: CT PELVIS WITHOUT CONTRAST TECHNIQUE: Multidetector CT imaging of the  pelvis was performed following the standard protocol without intravenous contrast. COMPARISON:  CT abdomen and pelvis 03/24/2014. FINDINGS: Urinary Tract: The urinary bladder is completely decompressed with a Foley catheter in place. Bowel: Sigmoid diverticulosis is noted. Imaged bowel loops are otherwise unremarkable. Vascular/Lymphatic: Atherosclerotic vascular disease is seen. No lymphadenopathy. Reproductive:  No mass or other significant abnormality Other:  No fluid collection. Musculoskeletal: The hips are located. No fracture is identified. Small bone islands in the right sacrum and left ilium are unchanged. Mild right hip joint space narrowing is identified. Mild degenerative change is also seen at the symphysis pubis. There is some facet arthropathy and a disc bulge at L5-S1. IMPRESSION: Negative for fracture.  No acute abnormality. Lower lumbar spondylosis. Mild degenerative change right hip and symphysis pubis also noted. Atherosclerosis. Diverticulosis. Electronically Signed  By: Drusilla Kanner M.D.   On: 05/06/2018 15:15   Mr Brain Wo Contrast  Result Date: 05/07/2018 CLINICAL DATA:  Ataxia EXAM: MRI HEAD WITHOUT CONTRAST MRA HEAD WITHOUT CONTRAST TECHNIQUE: Multiplanar, multiecho pulse sequences of the brain and surrounding structures were obtained without intravenous contrast. Angiographic images of the head were obtained using MRA technique without contrast. COMPARISON:  Head CT 05/06/2018 FINDINGS: MRI HEAD FINDINGS BRAIN: There is abnormal diffusion restriction of the left lentiform nucleus, posterior limb of the left internal capsule and medial left temporal lobe. The midline structures are normal. Early confluent hyperintense T2-weighted signal of the periventricular and deep white matter, most commonly due to chronic ischemic microangiopathy. Generalized atrophy without lobar predilection. Single focus of chronic microhemorrhage in the left frontal lobe. SKULL AND UPPER CERVICAL SPINE:  The visualized skull base, calvarium, upper cervical spine and extracranial soft tissues are normal. SINUSES/ORBITS: No fluid levels or advanced mucosal thickening. No mastoid or middle ear effusion. The orbits are normal. MRA HEAD FINDINGS Intracranial internal carotid arteries: There is diminished flow related enhancement of the left internal carotid artery lacerum, cavernous and supraclinoid segments. There is a central defect within the cavernous segment and at the carotid terminus. The right ICA is normal. Anterior cerebral arteries: Normal. Middle cerebral arteries: The right MCA is normal. The left middle cerebral artery is patent proximally. However, there is limited flow related enhancement of the distal branches. Posterior communicating arteries: Present on the left. Posterior cerebral arteries: Normal. Basilar artery: Normal. Vertebral arteries: Codominant normal. Superior cerebellar arteries: Normal. Inferior cerebellar arteries: Normal. IMPRESSION: 1. 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 No hemorrhage or mass effect. 2. Diminished flow related enhancement of the left internal carotid artery at the skull base with suspected acute thrombus. The left middle cerebral artery is normal proximally, but there is minimal flow related enhancement of the distal branches. 3. Chronic ischemic microangiopathy and generalized atrophy. Electronically Signed   By: Deatra Robinson M.D.   On: 05/07/2018 01:58   US Carotid Bilateral (at [facility] And [facility] Only)  Result Date: 05/07/2018 CLINICAL DATA:  84 year old female with symptoms of transient ischemic attack EXAM: BILATERAL CAROTID DUPLEX ULTRASOUND TECHNIQUE: Wallace Cullens scale imaging, color Doppler and duplex ultrasound were performed of bilateral carotid and vertebral arteries in the neck. COMPARISON:  84 year old female with symptoms of transient ischemic attack FINDINGS: Criteria: Quantification of  carotid stenosis is based on velocity parameters that correlate the residual internal carotid diameter with NASCET-based stenosis levels, using the diameter of the distal internal carotid lumen as the denominator for stenosis measurement. The following velocity measurements were obtained: RIGHT ICA: 74/18 cm/sec CCA: 108/16 cm/sec SYSTOLIC ICA/CCA RATIO:  0.7 ECA:  132 cm/sec LEFT ICA: 41/4 cm/sec CCA: 9 9/7 cm/sec SYSTOLIC ICA/CCA RATIO:  0.4 ECA:  137 cm/sec RIGHT CAROTID ARTERY: No significant atherosclerotic plaque or evidence of stenosis in the internal carotid artery. RIGHT VERTEBRAL ARTERY:  Patent with normal antegrade flow. LEFT CAROTID ARTERY: Mild focal heterogeneous atherosclerotic plaque in the proximal internal carotid artery. By peak systolic velocity criteria, the estimated stenosis remains less than 50%. LEFT VERTEBRAL ARTERY:  Patent with normal antegrade flow. IMPRESSION: 1. Mild (1-49%) stenosis proximal left internal carotid artery secondary to focal heterogeneous atherosclerotic plaque. 2. No significant atherosclerotic plaque or evidence of stenosis in the right internal carotid artery. 3. Vertebral arteries are patent with normal antegrade flow. Signed, Sterling Big, MD, RPVI [facility] Radiology Electronically Signed   By: Malachy Moan M.D.   On: 05/07/2018 10:48   Dg Chest Portable 1 View  Result Date: 05/06/2018 CLINICAL DATA:  Right-sided weakness, fall EXAM: PORTABLE CHEST 1 VIEW COMPARISON:  03/24/2014, CT 05/06/2014 FINDINGS: The patient's hand obscures the left lower chest. Small right-sided pleural effusion. Enlarged cardiomediastinal silhouette with vascular congestion and hazy atelectasis or edema at the bases. No pneumothorax. IMPRESSION: 1. Cardiomegaly with vascular congestion and hazy bibasilar atelectasis or mild edema. 2. Tiny right pleural effusion Electronically Signed   By: Jasmine Pang M.D.   On:  05/06/2018 14:58   Mr Maxine Glenn Head/brain ZO Cm  Result Date: 05/07/2018 CLINICAL DATA:  Ataxia EXAM: MRI HEAD WITHOUT CONTRAST MRA HEAD WITHOUT CONTRAST TECHNIQUE: Multiplanar, multiecho pulse sequences of the brain and surrounding structures were obtained without intravenous contrast. Angiographic images of the head were obtained using MRA technique without contrast. COMPARISON:  Head CT 05/06/2018 FINDINGS: MRI HEAD FINDINGS BRAIN: There is abnormal diffusion restriction of the left lentiform nucleus, posterior limb of the left internal capsule and medial left temporal lobe. The midline structures are normal. Early confluent hyperintense T2-weighted signal of the periventricular and deep white matter, most commonly due to chronic ischemic microangiopathy. Generalized atrophy without lobar predilection. Single focus of chronic microhemorrhage in the left frontal lobe. SKULL AND UPPER CERVICAL SPINE: The visualized skull base, calvarium, upper cervical spine and extracranial soft tissues are normal. SINUSES/ORBITS: No fluid levels or advanced mucosal thickening. No mastoid or middle ear effusion. The orbits are normal. MRA HEAD FINDINGS Intracranial internal carotid arteries: There is diminished flow related enhancement of the left internal carotid artery lacerum, cavernous and supraclinoid segments. There is a central defect within the cavernous segment and at the carotid terminus. The right ICA is normal. Anterior cerebral arteries: Normal. Middle cerebral arteries: The right MCA is normal. The left middle cerebral artery is patent proximally. However, there is limited flow related enhancement of the distal branches. Posterior communicating arteries: Present on the left. Posterior cerebral arteries: Normal. Basilar artery: Normal. Vertebral arteries: Codominant normal. Superior cerebellar arteries: Normal. Inferior cerebellar arteries: Normal. IMPRESSION: 1. Acute infarct of the left lentiform nucleus, posterior  limb of the internal capsule and medial left temporal lobe. No hemorrhage or mass effect. 2. Diminished flow related enhancement of the left internal carotid artery at the skull base with suspected acute thrombus. The left middle cerebral artery is normal proximally, but there is minimal flow related enhancement of the distal branches. 3. Chronic ischemic microangiopathy and generalized atrophy. Electronically Signed   By: Deatra Robinson M.D.   On: 05/07/2018 01:58     CBC Recent Labs  Lab 05/06/18 1419 05/08/18 0608  WBC 15.2* 8.7  HGB 15.5* 12.8  HCT 44.8 37.9  PLT 246 192  MCV 88.4 90.0  MCH 30.6 30.4  MCHC 34.6 33.8  RDW 12.9 13.0  LYMPHSABS 0.7 1.1  MONOABS 0.9 0.7  EOSABS 0.0 0.0  BASOSABS 0.0 0.0    Chemistries  Recent Labs  Lab 05/06/18 1419 05/08/18 0608  NA 139 142  K 3.4* 2.8*  CL 102 108  CO2 22 21*  GLUCOSE 126* 67*  BUN 21 15  CREATININE 0.63 0.55  CALCIUM 9.2 7.8*  MG  --  1.8  AST 61*  --   ALT 28  --   ALKPHOS 58  --   BILITOT 2.0*  --    ------------------------------------------------------------------------------------------------------------------ estimated creatinine clearance is 36.7 mL/min (by C-G formula  based on SCr of 0.55 mg/dL). ------------------------------------------------------------------------------------------------------------------ Recent Labs    05/06/18 1419  HGBA1C 5.5   ------------------------------------------------------------------------------------------------------------------ Recent Labs    05/07/18 0430  CHOL 150  HDL 49  LDLCALC 87  TRIG 71  CHOLHDL 3.1   ------------------------------------------------------------------------------------------------------------------ No results for input(s): TSH, T4TOTAL, T3FREE, THYROIDAB in the last 72 hours.  Invalid input(s): FREET3 ------------------------------------------------------------------------------------------------------------------ No results for  input(s): VITAMINB12, FOLATE, FERRITIN, TIBC, IRON, RETICCTPCT in the last 72 hours.  Coagulation profile Recent Labs  Lab 05/06/18 1419  INR 1.18    No results for input(s): DDIMER in the last 72 hours.  Cardiac Enzymes Recent Labs  Lab 05/06/18 1419 05/08/18 0608  TROPONINI 0.03* 0.04*   ------------------------------------------------------------------------------------------------------------------ Invalid input(s): POCBNP    Assessment & Plan   *Acute CVA of left internal capsule -Echo shows no evidence of clot, carotid Doppler showed no significant stenosis -Continue t aspirin .  Statin once patient able to swallow - Lovenox for DVT prophylaxis. - PT/OT/Speech consult as needed per symptoms -Discussed with patient's family patient not showing any significant improvement if by tomorrow no improvement would consider making her comfort care with hospice home  *Uncontrolled hypertension.  Unable to take po hold medication  *Hypothyroidism.  hold levothyroxine for now  *Acute rhabdomyolysis.    Continue IV fluids.  *Hypokalemia replace potassium  DVT prophylaxis with Lovenox     Code Status Orders  (From admission, onward)         Start     Ordered   05/06/18 2126  Do not attempt resuscitation (DNR)  Continuous    Question Answer Comment  In the event of cardiac or respiratory ARREST Do not call a "code blue"   In the event of cardiac or respiratory ARREST Do not perform Intubation, CPR, defibrillation or ACLS   In the event of cardiac or respiratory ARREST Use medication by any route, position, wound care, and other measures to relive pain and suffering. May use oxygen, suction and manual treatment of airway obstruction as needed for comfort.      05/06/18 2125        Code Status History    Date Active Date Inactive Code Status Order ID Comments User Context   05/06/2018 1626 05/06/2018 2125 Full Code [id number]  Milagros Loll, MD ED    Advance  Directive Documentation     Most Recent Value  Type of Advance Directive  Healthcare Power of Attorney, Living will  Pre-existing out of facility DNR order (yellow form or pink MOST form)  -  "MOST" Form in Place?  -           Consults  neuro DVT Prophylaxis  Lovenox   Lab Results  Component Value Date   PLT 192 05/08/2018     Time Spent in minutes    Greater than 50% of time spent in care coordination and counseling patient regarding the condition and plan of care.   Auburn Bilberry M.D on 05/08/2018 at 1:13 PM  Between 7am to 6pm - Pager - [phone]  After 6pm go to [email or web address] - Social research officer, government  [employer]   Office  [phone]

## 2018-05-08 NOTE — Progress Notes (Signed)
Physical Therapy Treatment Patient Details Name: Jenny Molina MRN: [id number] DOB: 1923/11/11 Today's Date: 05/08/2018    History of Present Illness 84 y.o. female with a history of HTN found down in her independent living facility [facility]).  Unclear how long she had been down but suspected for days since son was unable to contact on Monday.  Patient now with R-sided weakness and visual field deficits.  MRI of the brain reviewed and shows an acute infarct involving the left lentiform nucleus, posterior limb of the internal capsule and medial left temporal lobe.  MRA shows possible acute thrombus in the left internal carotid artery. Carotid dopplers show no evidence of hemodynamically significant stenosis. Echocardiogram pending.    PT Comments    Pt awake with family attentive.  Participated in exercises as described below.  Pt was max A/dependant to transition to sitting.  She was able to sit x 5 minutes with min/mod a x 1 due to right lean.  Unable to sit unattended.  Overall tolerated well but limited by fatigue.  OOB/transfers deferred due to weakness.  Potassium noted to be 2.8 but asymptomatic.  Family in for session and very attentive to pt's needs.   Follow Up Recommendations  SNF     Equipment Recommendations       Recommendations for Other Services       Precautions / Restrictions Precautions Precautions: Fall Precaution Comments: poor awareness of R side with cues to turn head out of L gaze preference--family ed on standing on R side to stimulate R side    Mobility  Bed Mobility Overal bed mobility: Needs Assistance Bed Mobility: Supine to Sit;Sit to Supine     Supine to sit: Max assist;Total assist Sit to supine: Max assist;Total assist   General bed mobility comments: full assist for all mobility  Transfers                 General transfer comment: Did not assess this date d/t cognitive status and safety concerns.  Ambulation/Gait              General Gait Details: Did not assess this date d/t cognitive status and safety concerns.   Stairs             Wheelchair Mobility    Modified Rankin (Stroke Patients Only)       Balance Overall balance assessment: Needs assistance Sitting-balance support: Bilateral upper extremity supported;Feet supported Sitting balance-Leahy Scale: Poor Sitting balance - Comments: min/mod assist today to maintain upright.  Unable to sit unattended.  R lean       Standing balance comment: deferred                            Cognition Arousal/Alertness: Awake/alert   Overall Cognitive Status: Impaired/Different from baseline                                        Exercises Other Exercises Other Exercises: supine AAROM LLE, PROM RLE ankle pumps, heel slides, ab/add and slr x 10 Other Exercises: sitting EOB x 5 minutes    General Comments        Pertinent Vitals/Pain Pain Assessment: No/denies pain    Home Living                      Prior Function  PT Goals (current goals can now be found in the care plan section) Acute Rehab PT Goals Patient Stated Goal: Unable to state goal Progress towards PT goals: Progressing toward goals    Frequency    7X/week      PT Plan Current plan remains appropriate    Co-evaluation              AM-PAC PT "6 Clicks" Daily Activity  Outcome Measure  Difficulty turning over in bed (including adjusting bedclothes, sheets and blankets)?: Unable Difficulty moving from lying on back to sitting on the side of the bed? : Unable Difficulty sitting down on and standing up from a chair with arms (e.g., wheelchair, bedside commode, etc,.)?: Unable Help needed moving to and from a bed to chair (including a wheelchair)?: Total Help needed walking in hospital room?: Total Help needed climbing 3-5 steps with a railing? : Total 6 Click Score: 6    End of Session Equipment Utilized  During Treatment: Gait belt Activity Tolerance: Patient tolerated treatment well;Patient limited by fatigue Patient left: in bed;with call bell/phone within reach;with bed alarm set;with family/visitor present   Hemiplegia - Right/Left: Right Hemiplegia - caused by: Cerebral infarction     Time: 4098-1191 PT Time Calculation (min) (ACUTE ONLY): 17 min  Charges:  $Therapeutic Exercise: 8-22 mins                     Danielle Dess, PTA 05/08/18, 2:48 PM

## 2018-05-08 NOTE — Progress Notes (Signed)
Subjective: Remains stable with no significant events reported overnight.  Objective: Current vital signs: BP 128/68 (BP Location: Left Arm)   Pulse 89   Temp 97.7 F (36.5 C) (Oral)   Resp 16   Ht 5\' 5"  (1.651 m)   Wt 51.8 kg   SpO2 92%   BMI 19.00 kg/m  Vital signs in last 24 hours: Temp:  [97.1 F (36.2 C)-98.6 F (37 C)] 97.7 F (36.5 C) (10/24 0406) Pulse Rate:  [66-89] 89 (10/24 0406) Resp:  [16-20] 16 (10/24 0406) BP: (128-158)/(49-70) 128/68 (10/24 0406) SpO2:  [92 %-96 %] 92 % (10/24 0406)  Intake/Output from previous day: 10/23 0701 - 10/24 0700 In: 1890.4 [I.V.:1890.4] Out: 2700 [Urine:2700] Intake/Output this shift: No intake/output data recorded. Nutritional status:  Diet Order            Diet NPO time specified  Diet effective now              Neurologic Exam:  Mental Status: Lethargic.  Does not follow commands.  Speech mumbled and unintelligible. Cranial Nerves: II: Discs flat bilaterally; Does not blink to confrontation from the right, pupils equal, round, reactive to light and accommodation III,IV, VI: ptosis not present, left gaze preference V,VII: right facial droop VIII: hearing normal bilaterally IX,X: gag reflex reduced XI: unable to test due to cooperation XII: unable to test due to cooperation Motor: 0/5 movement on the right upper and lower extremities.  Patient moves left upper extremity against gravity spontaneously.  Minimal movement noted of the LLE Sensory: Patient appreciates painful stimuli bilaterally Deep Tendon Reflexes: 2+ and symmetric with absent AJ's bilaterally Plantars: Right: upgoing                         Left: upgoing Cerebellar: unable to test Gait: not tested due to safety concerns  Lab Results: Basic Metabolic Panel: Recent Labs  Lab 05/06/18 1419 05/08/18 0608  NA 139 142  K 3.4* 2.8*  CL 102 108  CO2 22 21*  GLUCOSE 126* 67*  BUN 21 15  CREATININE 0.63 0.55  CALCIUM 9.2 7.8*  MG  --  1.8   PHOS  --  2.5    Liver Function Tests: Recent Labs  Lab 05/06/18 1419  AST 61*  ALT 28  ALKPHOS 58  BILITOT 2.0*  PROT 7.1  ALBUMIN 4.1   No results for input(s): LIPASE, AMYLASE in the last 168 hours. No results for input(s): AMMONIA in the last 168 hours.  CBC: Recent Labs  Lab 05/06/18 1419 05/08/18 0608  WBC 15.2* 8.7  NEUTROABS 13.6* 6.9  HGB 15.5* 12.8  HCT 44.8 37.9  MCV 88.4 90.0  PLT 246 192    Cardiac Enzymes: Recent Labs  Lab 05/06/18 1419 05/07/18 0430 05/08/18 0608  CKTOTAL 1,678* 539* 213  TROPONINI 0.03*  --  0.04*    Lipid Panel: Recent Labs  Lab 05/07/18 0430  CHOL 150  TRIG 71  HDL 49  CHOLHDL 3.1  VLDL 14  LDLCALC 87    CBG: Recent Labs  Lab 05/07/18 1636 05/07/18 2105 05/07/18 2359 05/08/18 0602 05/08/18 0645  GLUCAP 74 68* 71 69* 155*    Microbiology: No results found for this or any previous visit.  Coagulation Studies: Recent Labs    05/06/18 1419  LABPROT 14.9  INR 1.18    Imaging: Ct Head Wo Contrast  Result Date: 05/06/2018 CLINICAL DATA:  84 year old female fell and presumed to be on floor  for 3 days per neighbor. Right-sided weakness. Initial encounter. EXAM: CT HEAD WITHOUT CONTRAST CT CERVICAL SPINE WITHOUT CONTRAST TECHNIQUE: Multidetector CT imaging of the head and cervical spine was performed following the standard protocol without intravenous contrast. Multiplanar CT image reconstructions of the cervical spine were also generated. COMPARISON:  12/25/2013 CT. FINDINGS: CT HEAD FINDINGS Brain: Age-indeterminate infarct versus prominent chronic microvascular changes genu of the left internal capsule. Otherwise no CT evidence of large acute infarct. No intracranial hemorrhage. Prominent chronic microvascular changes. Global atrophy. No intracranial mass lesion noted on this unenhanced exam. Vascular: No hyperdense vessel. Skull: No skull fracture. Sinuses/Orbits: Post lens replacement. No acute orbital  abnormality. Mucosal thickening ethmoid sinus air cells. Other: Mastoid air cells and middle ear cavities are clear. CT CERVICAL SPINE FINDINGS Alignment: Mild rotation head and C1 upon C2. Skull base and vertebrae: No cervical spine fracture. Soft tissues and spinal canal: No abnormal prevertebral soft tissue swelling. Disc levels: Cervical spondylotic changes most notable C4-5 through C6-7. No high-grade spinal stenosis. Upper chest: No worrisome abnormality. Other: No worrisome neck mass.  Carotid bifurcation calcifications. IMPRESSION: CT HEAD 1. Age-indeterminate infarct versus prominent chronic microvascular changes genu of the left internal capsule. Otherwise no CT evidence of large acute infarct. 2. No skull fracture or intracranial hemorrhage. 3. Prominent chronic microvascular changes. 4. Global atrophy. CT CERVICAL SPINE 1. Mild rotation head/C1 upon C2 may be related to head position. 2. No cervical spine fracture or abnormal prevertebral soft tissue swelling. 3. Spondylotic changes most notable C4-5 through C6-7. No high-grade spinal stenosis. Electronically Signed   By: Lacy Duverney M.D.   On: 05/06/2018 15:27   Ct Angio Neck W Or Wo Contrast  Result Date: 05/07/2018 CLINICAL DATA:  84 y/o  F; follow-up of stroke. EXAM: CT ANGIOGRAPHY NECK TECHNIQUE: Multidetector CT imaging of the neck was performed using the standard protocol during bolus administration of intravenous contrast. Multiplanar CT image reconstructions and MIPs were obtained to evaluate the vascular anatomy. Carotid stenosis measurements (when applicable) are obtained utilizing NASCET criteria, using the distal internal carotid diameter as the denominator. CONTRAST:  75mL OMNIPAQUE IOHEXOL 350 MG/ML SOLN COMPARISON:  05/07/2018 carotid ultrasound. 05/07/2018 MRI of the head and MRA of the head. FINDINGS: Aortic arch: Common origin of right brachiocephalic and left common carotid arteries. Imaged portion shows no evidence of aneurysm  or dissection. No significant stenosis of the major arch vessel origins. Moderate calcific atherosclerosis. Right carotid system: No evidence of dissection, stenosis (50% or greater) or occlusion. Left carotid system: No evidence of dissection, stenosis (50% or greater) or occlusion in the neck. Non stenotic mild calcified plaque of the carotid bifurcation. Thrombotic occlusion of the left internal carotid artery starting from the distal cavernous segment to terminus, circle-of-Willis not within the field of view (series 8, image 71). Vertebral arteries: Codominant. No evidence of dissection, stenosis (50% or greater) or occlusion. Skeleton: Mild-to-moderate cervical spondylosis with multilevel disc and facet degenerative changes. No high-grade bony canal stenosis. Other neck: Negative. Upper chest: Biapical pleuroparenchymal scarring with calcification. Small right pleural effusion. IMPRESSION: 1. 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 Visible segments of the ICA are stable from the prior MRA of the head given differences in technique. 2. Patent carotid and vertebral arteries of the neck. No evidence of dissection, hemodynamically significant stenosis by NASCET criteria, or occlusion in the neck. 3. Small right pleural effusion. These results will be called to  the ordering clinician or representative by the Radiologist Assistant, and communication documented in the PACS or zVision Dashboard. Electronically Signed   By: Mitzi Hansen M.D.   On: 05/07/2018 13:56   Ct Cervical Spine Wo Contrast  Result Date: 05/06/2018 CLINICAL DATA:  84 year old female fell and presumed to be on floor for 3 days per neighbor. Right-sided weakness. Initial encounter. EXAM: CT HEAD WITHOUT CONTRAST CT CERVICAL SPINE WITHOUT CONTRAST TECHNIQUE: Multidetector CT imaging of the head and cervical spine was performed  following the standard protocol without intravenous contrast. Multiplanar CT image reconstructions of the cervical spine were also generated. COMPARISON:  12/25/2013 CT. FINDINGS: CT HEAD FINDINGS Brain: Age-indeterminate infarct versus prominent chronic microvascular changes genu of the left internal capsule. Otherwise no CT evidence of large acute infarct. No intracranial hemorrhage. Prominent chronic microvascular changes. Global atrophy. No intracranial mass lesion noted on this unenhanced exam. Vascular: No hyperdense vessel. Skull: No skull fracture. Sinuses/Orbits: Post lens replacement. No acute orbital abnormality. Mucosal thickening ethmoid sinus air cells. Other: Mastoid air cells and middle ear cavities are clear. CT CERVICAL SPINE FINDINGS Alignment: Mild rotation head and C1 upon C2. Skull base and vertebrae: No cervical spine fracture. Soft tissues and spinal canal: No abnormal prevertebral soft tissue swelling. Disc levels: Cervical spondylotic changes most notable C4-5 through C6-7. No high-grade spinal stenosis. Upper chest: No worrisome abnormality. Other: No worrisome neck mass.  Carotid bifurcation calcifications. IMPRESSION: CT HEAD 1. Age-indeterminate infarct versus prominent chronic microvascular changes genu of the left internal capsule. Otherwise no CT evidence of large acute infarct. 2. No skull fracture or intracranial hemorrhage. 3. Prominent chronic microvascular changes. 4. Global atrophy. CT CERVICAL SPINE 1. Mild rotation head/C1 upon C2 may be related to head position. 2. No cervical spine fracture or abnormal prevertebral soft tissue swelling. 3. Spondylotic changes most notable C4-5 through C6-7. No high-grade spinal stenosis. Electronically Signed   By: Lacy Duverney M.D.   On: 05/06/2018 15:27   Ct Pelvis Wo Contrast  Result Date: 05/06/2018 CLINICAL DATA:  Patient status post fall at some point over the past 3 days. Pelvic pain. Initial encounter. EXAM: CT PELVIS WITHOUT  CONTRAST TECHNIQUE: Multidetector CT imaging of the pelvis was performed following the standard protocol without intravenous contrast. COMPARISON:  CT abdomen and pelvis 03/24/2014. FINDINGS: Urinary Tract: The urinary bladder is completely decompressed with a Foley catheter in place. Bowel: Sigmoid diverticulosis is noted. Imaged bowel loops are otherwise unremarkable. Vascular/Lymphatic: Atherosclerotic vascular disease is seen. No lymphadenopathy. Reproductive:  No mass or other significant abnormality Other:  No fluid collection. Musculoskeletal: The hips are located. No fracture is identified. Small bone islands in the right sacrum and left ilium are unchanged. Mild right hip joint space narrowing is identified. Mild degenerative change is also seen at the symphysis pubis. There is some facet arthropathy and a disc bulge at L5-S1. IMPRESSION: Negative for fracture.  No acute abnormality. Lower lumbar spondylosis. Mild degenerative change right hip and symphysis pubis also noted. Atherosclerosis. Diverticulosis. Electronically Signed   By: Drusilla Kanner M.D.   On: 05/06/2018 15:15   Mr Brain Wo Contrast  Result Date: 05/07/2018 CLINICAL DATA:  Ataxia EXAM: MRI HEAD WITHOUT CONTRAST MRA HEAD WITHOUT CONTRAST TECHNIQUE: Multiplanar, multiecho pulse sequences of the brain and surrounding structures were obtained without intravenous contrast. Angiographic images of the head were obtained using MRA technique without contrast. COMPARISON:  Head CT 05/06/2018 FINDINGS: MRI HEAD FINDINGS BRAIN: There is abnormal diffusion restriction of the left lentiform nucleus, posterior  limb of the left internal capsule and medial left temporal lobe. The midline structures are normal. Early confluent hyperintense T2-weighted signal of the periventricular and deep white matter, most commonly due to chronic ischemic microangiopathy. Generalized atrophy without lobar predilection. Single focus of chronic microhemorrhage in the  left frontal lobe. SKULL AND UPPER CERVICAL SPINE: The visualized skull base, calvarium, upper cervical spine and extracranial soft tissues are normal. SINUSES/ORBITS: No fluid levels or advanced mucosal thickening. No mastoid or middle ear effusion. The orbits are normal. MRA HEAD FINDINGS Intracranial internal carotid arteries: There is diminished flow related enhancement of the left internal carotid artery lacerum, cavernous and supraclinoid segments. There is a central defect within the cavernous segment and at the carotid terminus. The right ICA is normal. Anterior cerebral arteries: Normal. Middle cerebral arteries: The right MCA is normal. The left middle cerebral artery is patent proximally. However, there is limited flow related enhancement of the distal branches. Posterior communicating arteries: Present on the left. Posterior cerebral arteries: Normal. Basilar artery: Normal. Vertebral arteries: Codominant normal. Superior cerebellar arteries: Normal. Inferior cerebellar arteries: Normal. IMPRESSION: 1. 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 No hemorrhage or mass effect. 2. Diminished flow related enhancement of the left internal carotid artery at the skull base with suspected acute thrombus. The left middle cerebral artery is normal proximally, but there is minimal flow related enhancement of the distal branches. 3. Chronic ischemic microangiopathy and generalized atrophy. Electronically Signed   By: Deatra Robinson M.D.   On: 05/07/2018 01:58   US Carotid Bilateral (at [facility] And [facility] Only)  Result Date: 05/07/2018 CLINICAL DATA:  84 year old female with symptoms of transient ischemic attack EXAM: BILATERAL CAROTID DUPLEX ULTRASOUND TECHNIQUE: Wallace Cullens scale imaging, color Doppler and duplex ultrasound were performed of bilateral carotid and vertebral arteries in the neck. COMPARISON:  84 year old female with symptoms of transient  ischemic attack FINDINGS: Criteria: Quantification of carotid stenosis is based on velocity parameters that correlate the residual internal carotid diameter with NASCET-based stenosis levels, using the diameter of the distal internal carotid lumen as the denominator for stenosis measurement. The following velocity measurements were obtained: RIGHT ICA: 74/18 cm/sec CCA: 108/16 cm/sec SYSTOLIC ICA/CCA RATIO:  0.7 ECA:  132 cm/sec LEFT ICA: 41/4 cm/sec CCA: 9 9/7 cm/sec SYSTOLIC ICA/CCA RATIO:  0.4 ECA:  137 cm/sec RIGHT CAROTID ARTERY: No significant atherosclerotic plaque or evidence of stenosis in the internal carotid artery. RIGHT VERTEBRAL ARTERY:  Patent with normal antegrade flow. LEFT CAROTID ARTERY: Mild focal heterogeneous atherosclerotic plaque in the proximal internal carotid artery. By peak systolic velocity criteria, the estimated stenosis remains less than 50%. LEFT VERTEBRAL ARTERY:  Patent with normal antegrade flow. IMPRESSION: 1. Mild (1-49%) stenosis proximal left internal carotid artery secondary to focal heterogeneous atherosclerotic plaque. 2. No significant atherosclerotic plaque or evidence of stenosis in the right internal carotid artery. 3. Vertebral arteries are patent with normal antegrade flow. Signed, Sterling Big, MD, RPVI [facility] Electronically Signed   By: Malachy Moan M.D.   On: 05/07/2018 10:48   Dg Chest Portable 1 View  Result Date: 05/06/2018 CLINICAL DATA:  Right-sided weakness, fall EXAM: PORTABLE CHEST 1 VIEW COMPARISON:  03/24/2014, CT 05/06/2014 FINDINGS: The patient's hand obscures the left lower chest. Small right-sided pleural effusion. Enlarged cardiomediastinal silhouette with vascular congestion and hazy atelectasis or edema at the bases. No pneumothorax. IMPRESSION: 1. Cardiomegaly with vascular congestion and hazy  bibasilar atelectasis or mild edema. 2. Tiny right pleural effusion  Electronically Signed   By: Jasmine Pang M.D.   On: 05/06/2018 14:58   Mr Maxine Glenn Head/brain ZO Cm  Result Date: 05/07/2018 CLINICAL DATA:  Ataxia EXAM: MRI HEAD WITHOUT CONTRAST MRA HEAD WITHOUT CONTRAST TECHNIQUE: Multiplanar, multiecho pulse sequences of the brain and surrounding structures were obtained without intravenous contrast. Angiographic images of the head were obtained using MRA technique without contrast. COMPARISON:  Head CT 05/06/2018 FINDINGS: MRI HEAD FINDINGS BRAIN: There is abnormal diffusion restriction of the left lentiform nucleus, posterior limb of the left internal capsule and medial left temporal lobe. The midline structures are normal. Early confluent hyperintense T2-weighted signal of the periventricular and deep white matter, most commonly due to chronic ischemic microangiopathy. Generalized atrophy without lobar predilection. Single focus of chronic microhemorrhage in the left frontal lobe. SKULL AND UPPER CERVICAL SPINE: The visualized skull base, calvarium, upper cervical spine and extracranial soft tissues are normal. SINUSES/ORBITS: No fluid levels or advanced mucosal thickening. No mastoid or middle ear effusion. The orbits are normal. MRA HEAD FINDINGS Intracranial internal carotid arteries: There is diminished flow related enhancement of the left internal carotid artery lacerum, cavernous and supraclinoid segments. There is a central defect within the cavernous segment and at the carotid terminus. The right ICA is normal. Anterior cerebral arteries: Normal. Middle cerebral arteries: The right MCA is normal. The left middle cerebral artery is patent proximally. However, there is limited flow related enhancement of the distal branches. Posterior communicating arteries: Present on the left. Posterior cerebral arteries: Normal. Basilar artery: Normal. Vertebral arteries: Codominant normal. Superior cerebellar arteries: Normal. Inferior cerebellar arteries: Normal. IMPRESSION: 1.  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 No hemorrhage or mass effect. 2. Diminished flow related enhancement of the left internal carotid artery at the skull base with suspected acute thrombus. The left middle cerebral artery is normal proximally, but there is minimal flow related enhancement of the distal branches. 3. Chronic ischemic microangiopathy and generalized atrophy. Electronically Signed   By: Deatra Robinson M.D.   On: 05/07/2018 01:58    Medications:  I have reviewed the patient's current medications. Prior to Admission:  Medications Prior to Admission  Medication Sig Dispense Refill Last Dose  . ALPRAZolam (XANAX) 0.5 MG tablet Take 1 tablet by mouth every 8 (eight) hours.   Unknown at Unknown  . amLODipine (NORVASC) 5 MG tablet Take 1 tablet by mouth daily.   Unknown at Unknown  . furosemide (LASIX) 20 MG tablet Take 1 tablet by mouth daily as needed.    prn at prn  . levothyroxine (SYNTHROID, LEVOTHROID) 50 MCG tablet Take 1 tablet by mouth daily before breakfast.   Unknown at Unknown  . metoprolol (TOPROL-XL) 200 MG 24 hr tablet Take 1 tablet by mouth daily.   Unknown at Unknown   Scheduled: . aspirin  300 mg Rectal Daily   Or  . aspirin  325 mg Oral Daily  . enoxaparin (LOVENOX) injection  40 mg Subcutaneous Q24H  . insulin aspart  0-9 Units Subcutaneous TID WC  . levothyroxine  50 mcg Oral QAC breakfast    Assessments: 84 y.o female presenting with right hemiplegia and visual field deficits found to have acute infarct involving the left lentiform nucleus, posterior limb of the internal capsule and medial left temporal lobe. Likely embolic from acute thrombus in the left ICA. CTA of the neck reviewed and showed  left ICA thrombosis with no evidence of dissection. She was outside window period for tPA or thrombectomy so was started on Aspirin.  Echocardiogram did not show evidence of PFO or source of  emboli. EEG abnormal secondary to an asymmetric background with frequent sharp transients over the right mid-temporal region Patient started on Vimpat.  Plan 1. Prophylactic therapy-Agree with ASA 300 mg rectally every day 2. Continue Lacosamide 100 mg IV BID 3. Statin for lipid management with target LDL<70 once patient able to take po 4. PT/OT/Speech 5. Frequent neuro checks per protocol 6. Liberal BP management 7. Agree with current management and disposition, per family wishes, would not want further aggressive management, testing or procedures.  This patient was staffed with Dr. Loretha Brasil, Doyle Askew who personally evaluated patient, reviewed documentation and agreed with assessment and plan of care as above.  Webb Silversmith, DNP, FNP-BC Board certified Nurse Practitioner Neurology Department    LOS: 2 days   @MPKSIGN @ 05/08/2018  9:27 AM

## 2018-05-08 NOTE — Progress Notes (Signed)
   05/08/18 1100  Clinical Encounter Type  Visited With Patient and family together  Visit Type Initial;Spiritual support  Referral From Family  Recommendations Follow up as needed.  Spiritual Encounters  Spiritual Needs Prayer   While waiting for a notary, Chaplain engaged the family and learned about the condition of the patient. The patient was not communicative. At the family's request Chaplain prayed for the patient and family.

## 2018-05-08 NOTE — Evaluation (Signed)
Clinical/Bedside Swallow Evaluation Patient Details  Name: Jenny Molina MRN: [id number] Date of Birth: March 28, 1924  Today's Date: 05/08/2018 Time: SLP Start Time (ACUTE ONLY): 1055 SLP Stop Time (ACUTE ONLY): 1155 SLP Time Calculation (min) (ACUTE ONLY): 60 min  Past Medical History:  Past Medical History:  Diagnosis Date  . HTN (hypertension)   . Hypothyroidism    Past Surgical History: History reviewed. No pertinent surgical history. HPI:  Pt is a 84 y.o. female with a known history of hypertension, hypothyroidism who is a resident of an independent living facility was found on the floor with last well-known 3 days back.  Patient is drowsy but answers a few questions.  From history from her facility she has been on the floor for 2 to 3 days.  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  She appears more alert today; R neglect but w/ support w/ pillow and towel rolls, she was able to attain midline status.  She acknowledges family members and SLP w/ few mumbled verbalizations; suspect experessive Aphasia.  She followed through w/ general tasks during the evaluation and engaged appropriately.    Assessment / Plan / Recommendation Clinical Impression  Pt appears to present w/ oropahryngeal phase dysphagia w/ increased risk for aspiration at this time. Pt has moderate+ R neglect and is min slow to respond; suspect expressive Aphasia. She presents w/ generalized weakness as well as noted R OM and UR weakness. With Mod+ support w/ pillow and towel rolls, she was able to attain midline status.  She acknowledges family members and SLP w/ few mumbled verbalizations. She followed through w/ general tasks during the evaluation and engaged appropriately but min drowsy.  Pt was stimulated and assessed first w/ OM exam and strategies to promote lingual/labial movements and swallowing. Noted min slower movements and R labial/lingual decreased  tone/strength. Pt engaged appropriately using oral swabs to increased lingual/labial movements then follow through w/ a pharyngeal swallow post stimulation. Min decreased hyolaryngeal excursion noted. Pt was given trials of single ice chips(small) w/ adequate oral control and no spillage. As the chip melted, a pharyngeal swallow followed w/ no overt s/s of aspiration and no decline in her low volume vocal quality. However, w/ the 5th trial - min larger ice chip, pt exhibited apparent uncoordinated swallowing followed by coughing. Trials stopped as pt may have been too fatigued for further. Thorough education and instruction given to family present on providing oral stimulation for swallowing via swabs later on today as pt tolerates w/ plan for SLP to return tomorrow for ongoing assessment of swallowing function for safe readiness to initiate an oral diet. Pt and family agreed. NSG and Neuro. NP updated. Swabs provided.  SLP Visit Diagnosis: Dysphagia, oropharyngeal phase (R13.12)    Aspiration Risk  Severe aspiration risk    Diet Recommendation  NPO w/ frequent oral care for oral stimulation and swallowing; hygiene. Aspiration precautions.  Medication Administration: Via alternative means    Other  Recommendations Recommended Consults: (Palliative Care consult) Oral Care Recommendations: Oral care QID;Staff/trained caregiver to provide oral care(OM stimulation for swallowing encouragement)   Follow up Recommendations (TBD)      Frequency and Duration min 3x week  2 weeks       Prognosis Prognosis for Safe Diet Advancement: Guarded Barriers to Reach Goals: (new CVA; age)      Swallow Study   General Date of Onset: 05/06/18 HPI: Pt is a 84 y.o. female with a known  history of hypertension, hypothyroidism who is a resident of an independent living facility was found on the floor with last well-known 3 days back.  Patient is drowsy but answers a few questions.  From history from her facility  she has been on the floor for 2 to 3 days.  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  She appears more alert today; R neglect but w/ support w/ pillow and towel rolls, she was able to attain midline status.  She acknowledges family members and SLP w/ few mumbled verbalizations; suspect experessive Aphasia.  She followed through w/ general tasks during the evaluation and engaged appropriately.  Type of Study: Bedside Swallow Evaluation Previous Swallow Assessment: none reported  Diet Prior to this Study: NPO(regular diet prior to this admission per family) Temperature Spikes Noted: No(wbc 8.7) Respiratory Status: Room air History of Recent Intubation: No Behavior/Cognition: Alert;Cooperative;Pleasant mood;Distractible;Requires cueing(R neglect; suspect expressive Aphasia) Oral Cavity Assessment: (few sore places gums/cheek) Oral Care Completed by SLP: Recent completion by staff(and family) Oral Cavity - Dentition: Poor condition(few broken teeth) Vision: (n/a; R neglect) Self-Feeding Abilities: Total assist(R neglect) Patient Positioning: Upright in bed(supported w/ pillows) Baseline Vocal Quality: Low vocal intensity(mumbled speech) Volitional Cough: Cognitively unable to elicit Volitional Swallow: Unable to elicit    Oral/Motor/Sensory Function Overall Oral Motor/Sensory Function: Mild impairment Facial ROM: Reduced right Facial Symmetry: Abnormal symmetry right Facial Strength: Reduced right(min) Lingual ROM: (appeared WFL) Lingual Symmetry: Within Functional Limits Lingual Strength: (difficult to assess) Velum: (CNT) Mandible: (appeared WFL)   Ice Chips Ice chips: Impaired Presentation: Spoon(fed; 5 trials) Oral Phase Impairments: Reduced lingual movement/coordination(suspect min) Oral Phase Functional Implications: (n/a) Pharyngeal Phase Impairments: Suspected delayed Swallow;Decreased hyoid-laryngeal movement;Cough -  Delayed(w/ 5th trial - min larger ice chip) Other Comments: no other trials were assessed today; OM stimulation strategies given   Thin Liquid Thin Liquid: Not tested    Nectar Thick Nectar Thick Liquid: Not tested   Honey Thick Honey Thick Liquid: Not tested   Puree Puree: Not tested   Solid     Solid: Not tested      [name], MS, CCC-SLP Watson,Katherine 05/08/2018,2:14 PM

## 2018-05-08 NOTE — Evaluation (Signed)
Occupational Therapy Evaluation Patient Details Name: Jenny Molina MRN: [id number] DOB: [birth year] Today's Date: 05/08/2018    History of Present Illness 84 y.o. female with a history of HTN found down in her independent living facility [facility]).  Unclear how long she had been down but suspected for days since son was unable to contact on Monday.  Patient now with R-sided weakness and visual field deficits.  MRI of the brain reviewed and shows an acute infarct involving the left lentiform nucleus, posterior limb of the internal capsule and medial left temporal lobe.  MRA shows possible acute thrombus in the left internal carotid artery. Carotid dopplers show no evidence of hemodynamically significant stenosis. Echocardiogram pending.   Clinical Impression   Pt is 84 year old female who was found on the floor in her ILF [facility]) and is suspected she was on the floor for 2-3 days per her daughter.  She has a new CVA in L internal capsule and L temporal lobe.  She presents with L gaze preference with difficulty focusing and able to come to midline with visual stimulation and max cues but was not able to turn head all the way to R past midline.  Discussed with family members to stand on her R side to encourage turning and looking to the R.  Her daughter is a Engineer, civil (consulting) and is familiar with these strategies and is currently going through chemo.  Pt has no active movement in RUE or hand and no subluxation noted and no pain noticed facially with PROM to 90 degrees and rec positioning on pillows to prevent edema.  Pt is max to total assist for all ADLs and unable to sit at EOB with assist of 2 people during PT session so this was not assessed this session. PLOF was independent in all ADLs using a RW per daughter's report and living at [facility].  Pt is soft spoken and not consistent with yes/no responses and unable to state other words during questions.  She also has decreased hearing in R ear. Pt  would benefit from skilled OT services to address ADL training, fine motor skills training, visual skills training, positioning, strengthening, and family ed and training.  Pt would benefit from SNF for continued rehab after discharge from hospital.    Follow Up Recommendations  SNF    Equipment Recommendations       Recommendations for Other Services       Precautions / Restrictions Precautions Precautions: Fall Precaution Comments: poor awareness of R side with cues to turn head out of L gaze preference--family ed on standing on R side to stimulate R side Restrictions Weight Bearing Restrictions: No      Mobility Bed Mobility                  Transfers                      Balance                                           ADL either performed or assessed with clinical judgement   ADL Overall ADL's : Needs assistance/impaired Eating/Feeding: Total assistance Eating/Feeding Details (indicate cue type and reason): Pt is currently NPO and awaiting bedside swallow eval from SP Grooming: Wash/dry hands;Wash/dry face;Set up;Maximal assistance;Cueing for sequencing;Bed level;Brushing hair Grooming Details (indicate  cue type and reason): Pt is able to initiate grooming tasks but only washes and dries her face by her chin and has sequencing and motor planning problems, unable to state more than yes or no and L gaze preference. Upper Body Bathing: Total assistance   Lower Body Bathing: Total assistance   Upper Body Dressing : Maximal assistance;Set up;Bed level   Lower Body Dressing: Total assistance;Set up   Toilet Transfer: Maximal assistance;+2 for physical assistance Toilet Transfer Details (indicate cue type and reason): Pt currently using bed pan due to decreased mobility and needs total assist and has external cath in place           General ADL Comments: Pt is max to total assist for all ADLs due to RUE and LE hemiplegia with no  active movement, poor bed mobility, L gaze preference, poor motor planning, poor sequencing     Vision Baseline Vision/History: Wears glasses Wears Glasses: At all times Patient Visual Report: Blurring of vision Additional Comments: Difficult to fully assess but has a L gaze preference with tight neck muscles due to this and able to track to midline only this session and not to R so most likely there is a visual field cut or loss.  Rec family and staff stand on R side to increase awareness and encourage turning to R.     Perception     Praxis      Pertinent Vitals/Pain Pain Assessment: No/denies pain     Hand Dominance Right   Extremity/Trunk Assessment Upper Extremity Assessment Upper Extremity Assessment: RUE deficits/detail RUE Deficits / Details: no active movement with bruising around top and sides of shoulder; no shoulder subluxation RUE Sensation: decreased light touch;decreased proprioception RUE Coordination: decreased fine motor;decreased gross motor LUE Deficits / Details: pt is very weak and unable to hold LUE against gravity well but could be due to decreased cognition as well.   LUE Sensation: WNL   Lower Extremity Assessment Lower Extremity Assessment: Defer to PT evaluation       Communication Communication Communication: HOH(decreased hearing in R ear)   Cognition Arousal/Alertness: Awake/alert Behavior During Therapy: Flat affect Overall Cognitive Status: Impaired/Different from baseline Area of Impairment: Orientation;Attention;Following commands;Problem solving                 Orientation Level: Disoriented to;Person;Place;Time;Situation Current Attention Level: Focused   Following Commands: Follows one step commands inconsistently;Follows multi-step commands inconsistently     Problem Solving: Slow processing;Difficulty sequencing;Requires verbal cues;Requires tactile cues;Decreased initiation     General Comments       Exercises      Shoulder Instructions      Home Living Family/patient expects to be discharged to:: Assisted living                             Home Equipment: Dan Humphreys - 2 wheels   Additional Comments: was living at [facility] ILF      Prior Functioning/Environment Level of Independence: Independent        Comments: Daughter Byrd Hesselbach stated she was independent in all ADLs using a RW.        OT Problem List: Decreased strength;Impaired balance (sitting and/or standing);Decreased cognition;Impaired tone;Decreased range of motion;Decreased activity tolerance;Decreased coordination;Impaired UE functional use      OT Treatment/Interventions: Self-care/ADL training;Neuromuscular education;Balance training;Visual/perceptual remediation/compensation;Patient/family education;Therapeutic activities    OT Goals(Current goals can be found in the care plan section) Acute Rehab OT Goals Patient Stated  Goal: Unable to state goal ADL Goals Pt Will Perform Eating: with set-up;sitting;with mod assist Pt Will Perform Grooming: with set-up;with mod assist;sitting Pt Will Perform Upper Body Dressing: with set-up;with max assist Pt Will Perform Lower Body Dressing: with max assist;with set-up;bed level Pt Will Transfer to Toilet: with max assist;with set-up;bedside commode Pt/caregiver will Perform Home Exercise Program: Right Upper extremity;With written HEP provided  OT Frequency: Min 2X/week   Barriers to D/C:            Co-evaluation              AM-PAC PT "6 Clicks" Daily Activity     Outcome Measure Help from another person eating meals?: A Lot Help from another person taking care of personal grooming?: A Lot Help from another person toileting, which includes using toliet, bedpan, or urinal?: Total Help from another person bathing (including washing, rinsing, drying)?: A Lot Help from another person to put on and taking off regular upper body clothing?: Total Help from another  person to put on and taking off regular lower body clothing?: Total 6 Click Score: 9   End of Session    Activity Tolerance: Patient tolerated treatment well Patient left: in bed;with call bell/phone within reach;with bed alarm set;with family/visitor present(daughter and pastor present)  OT Visit Diagnosis: Muscle weakness (generalized) (M62.81);Cognitive communication deficit (R41.841);Other abnormalities of gait and mobility (R26.89);Hemiplegia and hemiparesis;Other symptoms and signs involving the nervous system (R29.898) Symptoms and signs involving cognitive functions: Cerebral infarction Hemiplegia - Right/Left: Right Hemiplegia - dominant/non-dominant: Dominant Hemiplegia - caused by: Cerebral infarction                Time: 0920-1000 OT Time Calculation (min): 40 min Charges:  OT General Charges $OT Visit: 1 Visit OT Evaluation $OT Eval Low Complexity: 1 Low OT Treatments $Self Care/Home Management : 8-22 mins $Neuromuscular Re-education: 8-22 mins  Susanne Borders, OTR/L ascom [phone] 05/08/18, 10:55 AM

## 2018-05-08 NOTE — Progress Notes (Signed)
   05/08/18 1250  Clinical Encounter Type  Visited With Family  Visit Type Follow-up  Spiritual Encounters  Spiritual Needs Emotional   Chaplain encountered patient's daughter in hallway near cafeteria.  Chaplain utilized active and reflective listening as daughter recounted issues which led to patient's hospitalization.  Daughter also spoke of her own health issues and how things have been since her hospitalization.  Family open to ongoing chaplain support.

## 2018-05-08 NOTE — Clinical Social Work Note (Signed)
Clinical Social Work Assessment  Patient Details  Name: Jenny Molina MRN: [id number] Date of Birth: October 07, 1923  Date of referral:  05/08/18               Reason for consult:  Facility Placement                Permission sought to share information with:  Case Manager, Customer service manager, Family Supports Permission granted to share information::  Yes, Verbal Permission Granted  Name::        Agency::     Relationship::     Contact Information:     Housing/Transportation Living arrangements for the past 2 months:  Charity fundraiser of Information:  Adult Children Patient Interpreter Needed:  None Criminal Activity/Legal Involvement Pertinent to Current Situation/Hospitalization:  No - Comment as needed Significant Relationships:  Adult Children Lives with:  Self Do you feel safe going back to the place where you live?  Yes Need for family participation in patient care:  Yes (Comment)  Care giving concerns:  Patient lives at [place] living    Social Worker assessment / plan:  CSW consulted for SNF placement. CSW met with patient and daughter at bedside. Daughter states that patient lives alone at [facility]. Daughter and other family have discussed and they are in agreement with discharge to SNF. Patient and daughter gave permission for SNF bed search. CSW will initiate bed search and give bed offers when available. CSW will follow for discharge planning.   Employment status:  Retired Forensic scientist:  [employer] PT Recommendations:  [place] / Referral to community resources:  [place]  Patient/Family's Response to care:  Patient and daughter thanked CSW for assistance   Patient/Family's Understanding of and Emotional Response to Diagnosis, Current Treatment, and Prognosis:  Patient and daughter in agreement with discharge plan   Emotional Assessment Appearance:  Appears stated  age Attitude/Demeanor/Rapport:    Affect (typically observed):  Pleasant, Quiet Orientation:  Oriented to Self, Oriented to Place Alcohol / Substance use:  Not Applicable Psych involvement (Current and /or in the community):  No (Comment)  Discharge Needs  Concerns to be addressed:  Discharge Planning Concerns Readmission within the last 30 days:  No Current discharge risk:  None Barriers to Discharge:  Continued Medical Work up   [employer], [place] 05/08/2018, 10:28 AM

## 2018-05-08 NOTE — NC FL2 (Signed)
[place] MEDICAID FL2 LEVEL OF CARE SCREENING TOOL     IDENTIFICATION  Patient Name: Jenny Molina Birthdate: 08-Jan-1924 Sex: female Admission Date (Current Location): 05/06/2018  [place] and [place] Number:  Chiropodist and Address:  [facility], [address], [place], [place] [address]      Provider Number: [id number]  Attending Physician Name and Address:  Auburn Bilberry, MD  Relative Name and Phone Number:  Virgil Benedict- daughter [phone]    Current Level of Care: Hospital Recommended Level of Care: Skilled Nursing Facility Prior Approval Number:    Date Approved/Denied:   PASRR Number: [id number]  Discharge Plan: SNF    Current Diagnoses: Patient Active Problem List   Diagnosis Date Noted  . CVA (cerebral vascular accident) (HCC) 05/06/2018    Orientation RESPIRATION BLADDER Height & Weight     Self, Time, Place  Normal Indwelling catheter Weight: 114 lb 3.2 oz (51.8 kg) Height:  5\' 5"  (165.1 cm)  BEHAVIORAL SYMPTOMS/MOOD NEUROLOGICAL BOWEL NUTRITION STATUS  (none) (none) Continent Diet(NPO)  AMBULATORY STATUS COMMUNICATION OF NEEDS Skin   Extensive Assist Verbally Normal                       Personal Care Assistance Level of Assistance  Bathing, Feeding, Dressing Bathing Assistance: Limited assistance Feeding assistance: Independent Dressing Assistance: Limited assistance     Functional Limitations Info  Sight, Hearing, Speech Sight Info: Adequate Hearing Info: Adequate Speech Info: Adequate    SPECIAL CARE FACTORS FREQUENCY  PT (By licensed PT), OT (By licensed OT)     PT Frequency: 5 OT Frequency: 5            Contractures Contractures Info: Not present    Additional Factors Info  Code Status, Allergies Code Status Info: DNR Allergies Info: Benicar Olmesartan, Ciprofloxacin, Hydralazine, Hydrochlorothiazide, Penicillins, Sulfa Antibiotics           Current Medications  (05/08/2018):  This is the current hospital active medication list Current Facility-Administered Medications  Medication Dose Route Frequency Provider Last Rate Last Dose  . acetaminophen (TYLENOL) tablet 650 mg  650 mg Oral Q4H PRN Milagros Loll, MD       Or  . acetaminophen (TYLENOL) solution 650 mg  650 mg Per Tube Q4H PRN Milagros Loll, MD       Or  . acetaminophen (TYLENOL) suppository 650 mg  650 mg Rectal Q4H PRN Sudini, Wardell Heath, MD      . aspirin suppository 300 mg  300 mg Rectal Daily Sudini, Srikar, MD   300 mg at 05/07/18 1054   Or  . aspirin tablet 325 mg  325 mg Oral Daily Sudini, Srikar, MD      . dextrose 5 %-0.9 % sodium chloride infusion   Intravenous Continuous Barbaraann Rondo, MD 100 mL/hr at 05/08/18 0640    . enoxaparin (LOVENOX) injection 40 mg  40 mg Subcutaneous Q24H Milagros Loll, MD   40 mg at 05/07/18 2128  . hydrALAZINE (APRESOLINE) injection 10 mg  10 mg Intravenous Q6H PRN Sudini, Wardell Heath, MD      . insulin aspart (novoLOG) injection 0-9 Units  0-9 Units Subcutaneous TID WC Auburn Bilberry, MD      . lacosamide (VIMPAT) 100 mg in sodium chloride 0.9 % 25 mL IVPB  100 mg Intravenous Q12H Thana Farr, MD 70 mL/hr at 05/07/18 1806 100 mg at 05/07/18 1806  . levothyroxine (SYNTHROID, LEVOTHROID) tablet 50 mcg  50 mcg Oral QAC breakfast  Milagros Loll, MD      . LORazepam (ATIVAN) injection 0.5 mg  0.5 mg Intravenous Q6H PRN Milagros Loll, MD         Discharge Medications: Please see discharge summary for a list of discharge medications.  Relevant Imaging Results:  Relevant Lab Results:   Additional Information SSN: [id number]  Ruthe Mannan, [place]

## 2018-05-08 NOTE — Clinical Social Work Placement (Signed)
   CLINICAL SOCIAL WORK PLACEMENT  NOTE  Date:  05/08/2018  Patient Details  Name: Jenny Molina MRN: [id number] Date of Birth: [birth year]  Clinical Social Work is seeking post-discharge placement for this patient at the Skilled  Nursing Facility level of care (*CSW will initial, date and re-position this form in  chart as items are completed):  Yes   Patient/family provided with [facility] Clinical Social Work Department's list of facilities offering this level of care within the geographic area requested by the patient (or if unable, by the patient's family).  Yes   Patient/family informed of their freedom to choose among providers that offer the needed level of care, that participate in Medicare, Medicaid or managed care program needed by the patient, have an available bed and are willing to accept the patient.  Yes   Patient/family informed of [facility]'s ownership interest in [facility] and [facility], as well as of the fact that they are under no obligation to receive care at these facilities.  PASRR submitted to EDS on 05/08/18     PASRR number received on 05/08/18     Existing PASRR number confirmed on       FL2 transmitted to all facilities in geographic area requested by pt/family on 05/08/18     FL2 transmitted to all facilities within larger geographic area on       Patient informed that his/her managed care company has contracts with or will negotiate with certain facilities, including the following:            Patient/family informed of bed offers received.  Patient chooses bed at       Physician recommends and patient chooses bed at      Patient to be transferred to   on  .  Patient to be transferred to facility by       Patient family notified on   of transfer.  Name of family member notified:        PHYSICIAN       Additional Comment:    _______________________________________________ Ruthe Mannan, LCSWA 05/08/2018, 9:02 AM

## 2018-05-08 NOTE — Progress Notes (Signed)
Patient had a 5 beat run of SVT, MD notified, labs ordered per MD. Patient also became hypoglycemic this shift, patient has been NPO,  blood sugar 69, 1/2 amp of D50 given per protocol, IV fluids changed to D5 with normal saline per MD.Will continue  to monitor.

## 2018-05-08 NOTE — Progress Notes (Signed)
Inpatient Diabetes Program Recommendations  AACE/ADA: New Consensus Statement on Inpatient Glycemic Control (2015)  Target Ranges:  Prepandial:   less than 140 mg/dL      Peak postprandial:   less than 180 mg/dL (1-2 hours)      Critically ill patients:  140 - 180 mg/dL   Results for TIFFANI, KADOW (MRN [id number]) as of 05/08/2018 08:34  Ref. Range 05/07/2018 07:33 05/07/2018 11:52 05/07/2018 16:36 05/07/2018 21:05  Glucose-Capillary Latest Ref Range: 70 - 99 mg/dL 80 75 74 68 (L)   Results for JOLINDA, PINKSTAFF (MRN [id number]) as of 05/08/2018 08:34  Ref. Range 05/08/2018 06:02  Glucose-Capillary Latest Ref Range: 70 - 99 mg/dL 69 (L)    Admit with: Acute CVA and Rhabdomyolysis  NO History of Diabetes noted  Current Orders: Novolog Sensitive Correction Scale/ SSI (0-9 units) TID AC      MD- Per H&P, patient does not have History of Diabetes.  Has orders for Novolog SSI.  Mild Hypoglycemic events noted yesterday at bedtime and again this AM.  NO Insulin has been administered to patient since admission.  Please consider d/c of Novolog SSI for now     --Will follow patient during hospitalization--  Ambrose Finland RN, MSN, CDE Diabetes Coordinator Inpatient Glycemic Control Team Team Pager: [phone] (8a-5p)

## 2018-05-08 NOTE — Plan of Care (Signed)
  Problem: Education: Goal: Knowledge of disease or condition will improve Outcome: Not Progressing Goal: Knowledge of secondary prevention will improve Outcome: Not Progressing Goal: Knowledge of patient specific risk factors addressed and post discharge goals established will improve Outcome: Not Progressing   Problem: Coping: Goal: Will verbalize positive feelings about self Outcome: Not Progressing Goal: Will identify appropriate support needs Outcome: Not Progressing   Problem: Health Behavior/Discharge Planning: Goal: Ability to manage health-related needs will improve Outcome: Not Progressing   Problem: Self-Care: Goal: Ability to participate in self-care as condition permits will improve Outcome: Not Progressing Goal: Ability to communicate needs accurately will improve Outcome: Not Progressing   Problem: Nutrition: Goal: Risk of aspiration will decrease Outcome: Not Progressing   Problem: Ischemic Stroke/TIA Tissue Perfusion: Goal: Complications of ischemic stroke/TIA will be minimized Outcome: Not Progressing   Problem: Pain Managment: Goal: General experience of comfort will improve Outcome: Not Progressing   Problem: Safety: Goal: Ability to remain free from injury will improve Outcome: Not Progressing   Problem: Education: Goal: Knowledge of General Education information will improve Description Including pain rating scale, medication(s)/side effects and non-pharmacologic comfort measures Outcome: Not Progressing   Problem: Clinical Measurements: Goal: Ability to maintain clinical measurements within normal limits will improve Outcome: Not Progressing   Problem: Skin Integrity: Goal: Risk for impaired skin integrity will decrease Outcome: Not Progressing

## 2018-05-09 LAB — BASIC METABOLIC PANEL
ANION GAP: 7 (ref 5–15)
BUN: 10 mg/dL (ref 8–23)
CO2: 27 mmol/L (ref 22–32)
Calcium: 8.3 mg/dL — ABNORMAL LOW (ref 8.9–10.3)
Chloride: 105 mmol/L (ref 98–111)
Creatinine, Ser: 0.48 mg/dL (ref 0.44–1.00)
GFR calc Af Amer: >60 mL/min (ref >60)
GLUCOSE: 147 mg/dL — AB (ref 70–99)
POTASSIUM: 3.6 mmol/L (ref 3.5–5.1)
Sodium: 139 mmol/L (ref 135–145)

## 2018-05-09 LAB — GLUCOSE, CAPILLARY: GLUCOSE-CAPILLARY: 155 mg/dL — AB (ref 70–99)

## 2018-05-09 LAB — CALCIUM, IONIZED: Calcium, Ionized, Serum: 4.6 mg/dL (ref 4.5–5.6)

## 2018-05-09 MED ORDER — ATORVASTATIN CALCIUM 20 MG PO TABS
40.0000 mg | ORAL_TABLET | Freq: Every day | ORAL | Status: DC
  Administered 2018-05-09 – 2018-05-12 (×3): 40 mg via ORAL
  Filled 2018-05-09 (×3): qty 2

## 2018-05-09 MED ORDER — AMLODIPINE BESYLATE 5 MG PO TABS
5.0000 mg | ORAL_TABLET | Freq: Every day | ORAL | Status: DC
  Administered 2018-05-09 – 2018-05-10 (×2): 5 mg via ORAL
  Filled 2018-05-09 (×2): qty 1

## 2018-05-09 NOTE — Progress Notes (Signed)
Advanced care plan.  Purpose of the Encounter: Goals of care conversation Parties in Attendance: Patient's son patient  Patient's Decision Capacity: Not intact  Subjective/Patient's story: Patient's 84 year old who was found on the floor and was noted to have an acute CVA.   Objective/Medical story I discussed with the son regarding their desires for patient to have PEG tube placement or any other type of aggressive intervention done. Son states that patient had a living will and would not want any type of PEG tube placement.   Goals of care determination:  No PEG tube placement   CODE STATUS: Continue DNR status   Time spent discussing advanced care planning: 16 minutes

## 2018-05-09 NOTE — Clinical Social Work Note (Signed)
CSW met with patient's son Jerilynn Mages at bedside to discuss discharge planning. CSW gave son bed offers for patient. Son would like to discuss with his sister and let CSW know later what their decision is. CSW left phone number for him to call when decision is made. CSW will continue to follow for discharge planning.   [place], [place]

## 2018-05-09 NOTE — Progress Notes (Signed)
PT Cancellation Note  Patient Details Name: Jenny Molina MRN: [id number] DOB: 08-Feb-1924   Cancelled Treatment:    Reason Eval/Treat Not Completed: Patient's level of consciousness.  Patient currently very somnolent and unarousable to noxious stimuli.  Will re-attempt later if pt is appropriate.   Glenetta Hew, DPT 05/09/2018, 11:56 AM

## 2018-05-09 NOTE — Progress Notes (Signed)
Speech Language Pathology Treatment: Dysphagia  Patient Details Name: Jenny Molina MRN: [id number] DOB: [birth year] Today's Date: 05/09/2018 Time: 4098-1191 SLP Time Calculation (min) (ACUTE ONLY): 78 min  Assessment / Plan / Recommendation Clinical Impression  Pt seen for ongoing assessment for readiness for an oral diet. Today, she is min more alert w/ verbal engagement w/ SLP and Son present. Pt appears to have Expressive Language deficits/aphasia but unable to fully make any more formal assessment of such at this time. Pt continues to present w/ the Head turn Left but can attain midline w/ support and positioning and cues. Pt followed basic instruction w/ mod. Cues. She was easily fatigued w/ the physical demand of the po tasks but appeared to manage few trials adequately.  Pt consumed TSP trials of Nectar liquids and puree consistency food w/ no immediate, overt s/s of aspiration noted. Vocal quality was clear b/t trials; no decline in respiratory status during/post trials. Pt demonstrated need for ~2 swallows per trial to fully clear any pharyngeal residue. Swallows appeared complete. During the oral phase, pt exhibited min slower bolus management and A-P transfer but w/ time she completed the swallow and cleared orally - no pharyngeal residue remained post swallows indicating adequate lingual tone/strength for sweeping/clearing. Pt required mod. Cues throughout trials. She appeared to tire quickly w/ the exertion d/t decreased stamina for physical tasks. No further po trials given after ~7-8 trials of each consistency.  Thorough education w/ Son on dysphagia; risk for aspiration; monitoring pt's cues; swallowing strategies and support; aspiration precautions; diet consistency and food choices for now. Son expressed understanding and was thankful for the information and handouts.  Recommended a Dysphagia level 1 (PUREE) diet w/ Nectar consistency liquids; Pills in Puree - Crushed. Full feeding  support at meals. Precautions and strategies posted in room. NSG and MD updated. ST services will f/u w/ ongoing education and assessment while admitted.     HPI HPI: Pt is a 84 y.o. female with a known history of hypertension, hypothyroidism who is a resident of an independent living facility was found on the floor with last well-known 3 days back.  Patient is drowsy but answers a few questions.  From history from her facility she has been on the floor for 2 to 3 days.  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  She appears more alert today; R neglect but w/ support w/ pillow and towel rolls, she was able to attain midline status.  She acknowledges family members and SLP w/ few mumbled verbalizations; suspect experessive Aphasia.  She followed through w/ general tasks during the evaluation and engaged appropriately.       SLP Plan  Continue with current plan of care       Recommendations  Diet recommendations: Dysphagia 1 (puree);Nectar-thick liquid Liquids provided via: Teaspoon(only) Medication Administration: Crushed with puree Supervision: Full supervision/cueing for compensatory strategies;Trained caregiver to feed patient Compensations: Minimize environmental distractions;Slow rate;Small sips/bites;Lingual sweep for clearance of pocketing;Multiple dry swallows after each bite/sip;Follow solids with liquid(Verbal/tactile/visual cues as needed) Postural Changes and/or Swallow Maneuvers: Seated upright 90 degrees;Upright 30-60 min after meal                General recommendations: (Dietician f/u) Oral Care Recommendations: Oral care BID;Staff/trained caregiver to provide oral care Follow up Recommendations: Skilled Nursing facility(TBD) SLP Visit Diagnosis: Dysphagia, oropharyngeal phase (R13.12) Plan: Continue with current plan of care       GO  [name], MS,  CCC-SLP Donalee Gaumond 05/09/2018, 4:48 PM

## 2018-05-09 NOTE — Progress Notes (Signed)
Notified of 4.6 second pause on telemetry strip by CCMD. MD notified no new orders.

## 2018-05-09 NOTE — Progress Notes (Signed)
[employer] [facility] - [place] at [facility]                                                                                                                                                                                  Patient Demographics   Jenny Molina, is a 84 y.o. female, DOB - [birth year], ZOX:[id number]  Admit date - 05/06/2018   Admitting Physician Milagros Loll, MD  Outpatient Primary MD for the patient is Marguarite Arbour, MD   LOS - 3  Subjective: Patient able to follow some commands but unable to communicate effectively    Review of Systems:   CONSTITUTIONAL: unable to provide.    Vitals:   Vitals:   05/09/18 0358 05/09/18 0745 05/09/18 1218 05/09/18 1238  BP: (!) 157/75 (!) 175/80 (!) 157/96 (!) 187/88  Pulse: 72 79 82 75  Resp: 18 16 16 18   Temp: 98 F (36.7 C) 98.7 F (37.1 C) 98.3 F (36.8 C) 98.6 F (37 C)  TempSrc: Oral Oral Oral Oral  SpO2: 96% 94% 99% 96%  Weight:      Height:        Wt Readings from Last 3 Encounters:  05/06/18 51.8 kg  05/07/18 51.7 kg     Intake/Output Summary (Last 24 hours) at 05/09/2018 1355 Last data filed at 05/09/2018 1230 Gross per 24 hour  Intake 1831.29 ml  Output 2450 ml  Net -618.71 ml    Physical Exam:   GENERAL: Pleasant-appearing in no apparent distress.  HEAD, EYES, EARS, NOSE AND THROAT: Atraumatic, normocephalic. Extraocular muscles are intact. Pupils equal and reactive to light. Sclerae anicteric. No conjunctival injection. No oro-pharyngeal erythema.  NECK: Supple. There is no jugular venous distention. No bruits, no lymphadenopathy, no thyromegaly.  HEART: Regular rate and rhythm,. No murmurs, no rubs, no clicks.  LUNGS: Clear to auscultation bilaterally. No rales or rhonchi. No wheezes.  ABDOMEN: Soft, flat, nontender, nondistended. Has good bowel sounds. No hepatosplenomegaly appreciated.  EXTREMITIES: No evidence of any cyanosis, clubbing, or peripheral edema.  +2 pedal and radial  pulses bilaterally.  NEUROLOGIC: right upper unable to move follows some command not oriented SKIN: Moist and warm with no rashes appreciated.  Psych: Not anxious, depressed LN: No inguinal LN enlargement    Antibiotics   Anti-infectives (From admission, onward)   None      Medications   Scheduled Meds: . amLODipine  5 mg Oral Daily  . aspirin  300 mg Rectal Daily   Or  . aspirin  325 mg Oral Daily  . enoxaparin (LOVENOX) injection  40 mg Subcutaneous Q24H  . levothyroxine  50 mcg Oral QAC breakfast   Continuous Infusions: .  dextrose 5 % and 0.9 % NaCl with KCl 40 mEq/L 100 mL/hr at 05/09/18 1049  . lacosamide (VIMPAT) IV Stopped (05/09/18 0345)   PRN Meds:.acetaminophen **OR** acetaminophen (TYLENOL) oral liquid 160 mg/5 mL **OR** acetaminophen, hydrALAZINE, LORazepam   Data Review:   Micro Results No results found for this or any previous visit (from the past 240 hour(s)).  Radiology Reports Ct Head Wo Contrast  Result Date: 05/06/2018 CLINICAL DATA:  84 year old female fell and presumed to be on floor for 3 days per neighbor. Right-sided weakness. Initial encounter. EXAM: CT HEAD WITHOUT CONTRAST CT CERVICAL SPINE WITHOUT CONTRAST TECHNIQUE: Multidetector CT imaging of the head and cervical spine was performed following the standard protocol without intravenous contrast. Multiplanar CT image reconstructions of the cervical spine were also generated. COMPARISON:  12/25/2013 CT. FINDINGS: CT HEAD FINDINGS Brain: Age-indeterminate infarct versus prominent chronic microvascular changes genu of the left internal capsule. Otherwise no CT evidence of large acute infarct. No intracranial hemorrhage. Prominent chronic microvascular changes. Global atrophy. No intracranial mass lesion noted on this unenhanced exam. Vascular: No hyperdense vessel. Skull: No skull fracture. Sinuses/Orbits: Post lens replacement. No acute orbital abnormality. Mucosal thickening ethmoid sinus air cells.  Other: Mastoid air cells and middle ear cavities are clear. CT CERVICAL SPINE FINDINGS Alignment: Mild rotation head and C1 upon C2. Skull base and vertebrae: No cervical spine fracture. Soft tissues and spinal canal: No abnormal prevertebral soft tissue swelling. Disc levels: Cervical spondylotic changes most notable C4-5 through C6-7. No high-grade spinal stenosis. Upper chest: No worrisome abnormality. Other: No worrisome neck mass.  Carotid bifurcation calcifications. IMPRESSION: CT HEAD 1. Age-indeterminate infarct versus prominent chronic microvascular changes genu of the left internal capsule. Otherwise no CT evidence of large acute infarct. 2. No skull fracture or intracranial hemorrhage. 3. Prominent chronic microvascular changes. 4. Global atrophy. CT CERVICAL SPINE 1. Mild rotation head/C1 upon C2 may be related to head position. 2. No cervical spine fracture or abnormal prevertebral soft tissue swelling. 3. Spondylotic changes most notable C4-5 through C6-7. No high-grade spinal stenosis. Electronically Signed   By: Lacy Duverney M.D.   On: 05/06/2018 15:27   Ct Angio Neck W Or Wo Contrast  Result Date: 05/07/2018 CLINICAL DATA:  84 y/o  F; follow-up of stroke. EXAM: CT ANGIOGRAPHY NECK TECHNIQUE: Multidetector CT imaging of the neck was performed using the standard protocol during bolus administration of intravenous contrast. Multiplanar CT image reconstructions and MIPs were obtained to evaluate the vascular anatomy. Carotid stenosis measurements (when applicable) are obtained utilizing NASCET criteria, using the distal internal carotid diameter as the denominator. CONTRAST:  75mL OMNIPAQUE IOHEXOL 350 MG/ML SOLN COMPARISON:  05/07/2018 carotid ultrasound. 05/07/2018 MRI of the head and MRA of the head. FINDINGS: Aortic arch: Common origin of right brachiocephalic and left common carotid arteries. Imaged portion shows no evidence of aneurysm or dissection. No significant stenosis of the major arch  vessel origins. Moderate calcific atherosclerosis. Right carotid system: No evidence of dissection, stenosis (50% or greater) or occlusion. Left carotid system: No evidence of dissection, stenosis (50% or greater) or occlusion in the neck. Non stenotic mild calcified plaque of the carotid bifurcation. Thrombotic occlusion of the left internal carotid artery starting from the distal cavernous segment to terminus, circle-of-Willis not within the field of view (series 8, image 71). Vertebral arteries: Codominant. No evidence of dissection, stenosis (50% or greater) or occlusion. Skeleton: Mild-to-moderate cervical spondylosis with multilevel disc and facet degenerative changes. No high-grade bony canal stenosis. Other neck: Negative. Upper  chest: Biapical pleuroparenchymal scarring with calcification. Small right pleural effusion. IMPRESSION: 1. 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 Visible segments of the ICA are stable from the prior MRA of the head given differences in technique. 2. Patent carotid and vertebral arteries of the neck. No evidence of dissection, hemodynamically significant stenosis by NASCET criteria, or occlusion in the neck. 3. Small right pleural effusion. These results will be called to the ordering clinician or representative by the Radiologist Assistant, and communication documented in the PACS or zVision Dashboard. Electronically Signed   By: Mitzi Hansen M.D.   On: 05/07/2018 13:56   Ct Cervical Spine Wo Contrast  Result Date: 05/06/2018 CLINICAL DATA:  84 year old female fell and presumed to be on floor for 3 days per neighbor. Right-sided weakness. Initial encounter. EXAM: CT HEAD WITHOUT CONTRAST CT CERVICAL SPINE WITHOUT CONTRAST TECHNIQUE: Multidetector CT imaging of the head and cervical spine was performed following the standard protocol without intravenous contrast.  Multiplanar CT image reconstructions of the cervical spine were also generated. COMPARISON:  12/25/2013 CT. FINDINGS: CT HEAD FINDINGS Brain: Age-indeterminate infarct versus prominent chronic microvascular changes genu of the left internal capsule. Otherwise no CT evidence of large acute infarct. No intracranial hemorrhage. Prominent chronic microvascular changes. Global atrophy. No intracranial mass lesion noted on this unenhanced exam. Vascular: No hyperdense vessel. Skull: No skull fracture. Sinuses/Orbits: Post lens replacement. No acute orbital abnormality. Mucosal thickening ethmoid sinus air cells. Other: Mastoid air cells and middle ear cavities are clear. CT CERVICAL SPINE FINDINGS Alignment: Mild rotation head and C1 upon C2. Skull base and vertebrae: No cervical spine fracture. Soft tissues and spinal canal: No abnormal prevertebral soft tissue swelling. Disc levels: Cervical spondylotic changes most notable C4-5 through C6-7. No high-grade spinal stenosis. Upper chest: No worrisome abnormality. Other: No worrisome neck mass.  Carotid bifurcation calcifications. IMPRESSION: CT HEAD 1. Age-indeterminate infarct versus prominent chronic microvascular changes genu of the left internal capsule. Otherwise no CT evidence of large acute infarct. 2. No skull fracture or intracranial hemorrhage. 3. Prominent chronic microvascular changes. 4. Global atrophy. CT CERVICAL SPINE 1. Mild rotation head/C1 upon C2 may be related to head position. 2. No cervical spine fracture or abnormal prevertebral soft tissue swelling. 3. Spondylotic changes most notable C4-5 through C6-7. No high-grade spinal stenosis. Electronically Signed   By: Lacy Duverney M.D.   On: 05/06/2018 15:27   Ct Pelvis Wo Contrast  Result Date: 05/06/2018 CLINICAL DATA:  Patient status post fall at some point over the past 3 days. Pelvic pain. Initial encounter. EXAM: CT PELVIS WITHOUT CONTRAST TECHNIQUE: Multidetector CT imaging of the pelvis was  performed following the standard protocol without intravenous contrast. COMPARISON:  CT abdomen and pelvis 03/24/2014. FINDINGS: Urinary Tract: The urinary bladder is completely decompressed with a Foley catheter in place. Bowel: Sigmoid diverticulosis is noted. Imaged bowel loops are otherwise unremarkable. Vascular/Lymphatic: Atherosclerotic vascular disease is seen. No lymphadenopathy. Reproductive:  No mass or other significant abnormality Other:  No fluid collection. Musculoskeletal: The hips are located. No fracture is identified. Small bone islands in the right sacrum and left ilium are unchanged. Mild right hip joint space narrowing is identified. Mild degenerative change is also seen at the symphysis pubis. There is some facet arthropathy and a disc bulge at L5-S1. IMPRESSION: Negative for fracture.  No acute abnormality. Lower lumbar spondylosis. Mild degenerative change right hip and symphysis pubis also noted. Atherosclerosis. Diverticulosis. Electronically  Signed   By: Drusilla Kanner M.D.   On: 05/06/2018 15:15   Mr Brain Wo Contrast  Result Date: 05/07/2018 CLINICAL DATA:  Ataxia EXAM: MRI HEAD WITHOUT CONTRAST MRA HEAD WITHOUT CONTRAST TECHNIQUE: Multiplanar, multiecho pulse sequences of the brain and surrounding structures were obtained without intravenous contrast. Angiographic images of the head were obtained using MRA technique without contrast. COMPARISON:  Head CT 05/06/2018 FINDINGS: MRI HEAD FINDINGS BRAIN: There is abnormal diffusion restriction of the left lentiform nucleus, posterior limb of the left internal capsule and medial left temporal lobe. The midline structures are normal. Early confluent hyperintense T2-weighted signal of the periventricular and deep white matter, most commonly due to chronic ischemic microangiopathy. Generalized atrophy without lobar predilection. Single focus of chronic microhemorrhage in the left frontal lobe. SKULL AND UPPER CERVICAL SPINE: The  visualized skull base, calvarium, upper cervical spine and extracranial soft tissues are normal. SINUSES/ORBITS: No fluid levels or advanced mucosal thickening. No mastoid or middle ear effusion. The orbits are normal. MRA HEAD FINDINGS Intracranial internal carotid arteries: There is diminished flow related enhancement of the left internal carotid artery lacerum, cavernous and supraclinoid segments. There is a central defect within the cavernous segment and at the carotid terminus. The right ICA is normal. Anterior cerebral arteries: Normal. Middle cerebral arteries: The right MCA is normal. The left middle cerebral artery is patent proximally. However, there is limited flow related enhancement of the distal branches. Posterior communicating arteries: Present on the left. Posterior cerebral arteries: Normal. Basilar artery: Normal. Vertebral arteries: Codominant normal. Superior cerebellar arteries: Normal. Inferior cerebellar arteries: Normal. IMPRESSION: 1. 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 No hemorrhage or mass effect. 2. Diminished flow related enhancement of the left internal carotid artery at the skull base with suspected acute thrombus. The left middle cerebral artery is normal proximally, but there is minimal flow related enhancement of the distal branches. 3. Chronic ischemic microangiopathy and generalized atrophy. Electronically Signed   By: Deatra Robinson M.D.   On: 05/07/2018 01:58   US Carotid Bilateral (at [facility] And [facility] Only)  Result Date: 05/07/2018 CLINICAL DATA:  84 year old female with symptoms of transient ischemic attack EXAM: BILATERAL CAROTID DUPLEX ULTRASOUND TECHNIQUE: Wallace Cullens scale imaging, color Doppler and duplex ultrasound were performed of bilateral carotid and vertebral arteries in the neck. COMPARISON:  84 year old female with symptoms of transient ischemic attack FINDINGS: Criteria: Quantification of  carotid stenosis is based on velocity parameters that correlate the residual internal carotid diameter with NASCET-based stenosis levels, using the diameter of the distal internal carotid lumen as the denominator for stenosis measurement. The following velocity measurements were obtained: RIGHT ICA: 74/18 cm/sec CCA: 108/16 cm/sec SYSTOLIC ICA/CCA RATIO:  0.7 ECA:  132 cm/sec LEFT ICA: 41/4 cm/sec CCA: 9 9/7 cm/sec SYSTOLIC ICA/CCA RATIO:  0.4 ECA:  137 cm/sec RIGHT CAROTID ARTERY: No significant atherosclerotic plaque or evidence of stenosis in the internal carotid artery. RIGHT VERTEBRAL ARTERY:  Patent with normal antegrade flow. LEFT CAROTID ARTERY: Mild focal heterogeneous atherosclerotic plaque in the proximal internal carotid artery. By peak systolic velocity criteria, the estimated stenosis remains less than 50%. LEFT VERTEBRAL ARTERY:  Patent with normal antegrade flow. IMPRESSION: 1. Mild (1-49%) stenosis proximal left internal carotid artery secondary to focal heterogeneous atherosclerotic plaque. 2. No significant atherosclerotic plaque or evidence of stenosis in the right internal carotid artery. 3. Vertebral arteries are patent with normal antegrade flow. Signed, Sterling Big, MD, RPVI  [facility] Electronically Signed   By: Malachy Moan M.D.   On: 05/07/2018 10:48   Dg Chest Portable 1 View  Result Date: 05/06/2018 CLINICAL DATA:  Right-sided weakness, fall EXAM: PORTABLE CHEST 1 VIEW COMPARISON:  03/24/2014, CT 05/06/2014 FINDINGS: The patient's hand obscures the left lower chest. Small right-sided pleural effusion. Enlarged cardiomediastinal silhouette with vascular congestion and hazy atelectasis or edema at the bases. No pneumothorax. IMPRESSION: 1. Cardiomegaly with vascular congestion and hazy bibasilar atelectasis or mild edema. 2. Tiny right pleural effusion Electronically Signed   By: Jasmine Pang M.D.   On:  05/06/2018 14:58   Mr Maxine Glenn Head/brain LK Cm  Result Date: 05/07/2018 CLINICAL DATA:  Ataxia EXAM: MRI HEAD WITHOUT CONTRAST MRA HEAD WITHOUT CONTRAST TECHNIQUE: Multiplanar, multiecho pulse sequences of the brain and surrounding structures were obtained without intravenous contrast. Angiographic images of the head were obtained using MRA technique without contrast. COMPARISON:  Head CT 05/06/2018 FINDINGS: MRI HEAD FINDINGS BRAIN: There is abnormal diffusion restriction of the left lentiform nucleus, posterior limb of the left internal capsule and medial left temporal lobe. The midline structures are normal. Early confluent hyperintense T2-weighted signal of the periventricular and deep white matter, most commonly due to chronic ischemic microangiopathy. Generalized atrophy without lobar predilection. Single focus of chronic microhemorrhage in the left frontal lobe. SKULL AND UPPER CERVICAL SPINE: The visualized skull base, calvarium, upper cervical spine and extracranial soft tissues are normal. SINUSES/ORBITS: No fluid levels or advanced mucosal thickening. No mastoid or middle ear effusion. The orbits are normal. MRA HEAD FINDINGS Intracranial internal carotid arteries: There is diminished flow related enhancement of the left internal carotid artery lacerum, cavernous and supraclinoid segments. There is a central defect within the cavernous segment and at the carotid terminus. The right ICA is normal. Anterior cerebral arteries: Normal. Middle cerebral arteries: The right MCA is normal. The left middle cerebral artery is patent proximally. However, there is limited flow related enhancement of the distal branches. Posterior communicating arteries: Present on the left. Posterior cerebral arteries: Normal. Basilar artery: Normal. Vertebral arteries: Codominant normal. Superior cerebellar arteries: Normal. Inferior cerebellar arteries: Normal. IMPRESSION: 1. Acute infarct of the left lentiform nucleus, posterior  limb of the internal capsule and medial left temporal lobe. No hemorrhage or mass effect. 2. Diminished flow related enhancement of the left internal carotid artery at the skull base with suspected acute thrombus. The left middle cerebral artery is normal proximally, but there is minimal flow related enhancement of the distal branches. 3. Chronic ischemic microangiopathy and generalized atrophy. Electronically Signed   By: Deatra Robinson M.D.   On: 05/07/2018 01:58     CBC Recent Labs  Lab 05/06/18 1419 05/08/18 0608  WBC 15.2* 8.7  HGB 15.5* 12.8  HCT 44.8 37.9  PLT 246 192  MCV 88.4 90.0  MCH 30.6 30.4  MCHC 34.6 33.8  RDW 12.9 13.0  LYMPHSABS 0.7 1.1  MONOABS 0.9 0.7  EOSABS 0.0 0.0  BASOSABS 0.0 0.0    Chemistries  Recent Labs  Lab 05/06/18 1419 05/08/18 0608 05/09/18 0523  NA 139 142 139  K 3.4* 2.8* 3.6  CL 102 108 105  CO2 22 21* 27  GLUCOSE 126* 67* 147*  BUN 21 15 10   CREATININE 0.63 0.55 0.48  CALCIUM 9.2 7.8* 8.3*  MG  --  1.8  --   AST 61*  --   --   ALT 28  --   --   ALKPHOS  58  --   --   BILITOT 2.0*  --   --    ------------------------------------------------------------------------------------------------------------------ estimated creatinine clearance is 36.7 mL/min (by C-G formula based on SCr of 0.48 mg/dL). ------------------------------------------------------------------------------------------------------------------ Recent Labs    05/06/18 1419  HGBA1C 5.5   ------------------------------------------------------------------------------------------------------------------ Recent Labs    05/07/18 0430  CHOL 150  HDL 49  LDLCALC 87  TRIG 71  CHOLHDL 3.1   ------------------------------------------------------------------------------------------------------------------ No results for input(s): TSH, T4TOTAL, T3FREE, THYROIDAB in the last 72 hours.  Invalid input(s):  FREET3 ------------------------------------------------------------------------------------------------------------------ No results for input(s): VITAMINB12, FOLATE, FERRITIN, TIBC, IRON, RETICCTPCT in the last 72 hours.  Coagulation profile Recent Labs  Lab 05/06/18 1419  INR 1.18    No results for input(s): DDIMER in the last 72 hours.  Cardiac Enzymes Recent Labs  Lab 05/06/18 1419 05/08/18 0608  TROPONINI 0.03* 0.04*   ------------------------------------------------------------------------------------------------------------------ Invalid input(s): POCBNP    Assessment & Plan   *Acute CVA of left internal capsule -Echo shows no evidence of clot, carotid Doppler showed no significant stenosis -Continue t aspirin .   Start patient on statin - Lovenox for DVT prophylaxis. -Continue PT , and speech -Patient unable to be started on modified diet -Discussed with son they have no interest in patient having any type of PEG tube per patient's wishes from before  *Uncontrolled hypertension.  Resume amlodipine   *Hypothyroidism.    Resume levothyroxine for now  *Acute rhabdomyolysis.    Discontinue IV fluid  *Hypokalemia replace potassium recheck in the morning  DVT prophylaxis with Lovenox     Code Status Orders  (From admission, onward)         Start     Ordered   05/06/18 2126  Do not attempt resuscitation (DNR)  Continuous    Question Answer Comment  In the event of cardiac or respiratory ARREST Do not call a "code blue"   In the event of cardiac or respiratory ARREST Do not perform Intubation, CPR, defibrillation or ACLS   In the event of cardiac or respiratory ARREST Use medication by any route, position, wound care, and other measures to relive pain and suffering. May use oxygen, suction and manual treatment of airway obstruction as needed for comfort.      05/06/18 2125        Code Status History    Date Active Date Inactive Code Status Order ID  Comments User Context   05/06/2018 1626 05/06/2018 2125 Full Code [id number]  Milagros Loll, MD ED    Advance Directive Documentation     Most Recent Value  Type of Advance Directive  Healthcare Power of Attorney, Living will  Pre-existing out of facility DNR order (yellow form or pink MOST form)  -  "MOST" Form in Place?  -           Consults  neuro DVT Prophylaxis  Lovenox   Lab Results  Component Value Date   PLT 192 05/08/2018     Time Spent in minutes    Greater than 50% of time spent in care coordination and counseling patient regarding the condition and plan of care.   Auburn Bilberry M.D on 05/09/2018 at 1:55 PM  Between 7am to 6pm - Pager - [phone]  After 6pm go to [email or web address] - Social research officer, government  [employer]   Office  [phone]

## 2018-05-09 NOTE — Plan of Care (Signed)
Pt will speaks at intervals but follows commands. Pt does not appear to be in any distress at this time. Will continue to monitor.

## 2018-05-09 NOTE — Care Management Important Message (Signed)
Important Message  Patient Details  Name: Jenny Molina MRN: [id number] Date of Birth: [birth year]   Medicare Important Message Given:  Yes    Olegario Messier A Bellamarie Pflug 05/09/2018, 11:02 AM

## 2018-05-09 NOTE — Clinical Social Work Note (Signed)
Patient's son Kandice Hams [phone] contacted CSW for bed choice. Jillyn Hidden states that they have chosen [facility] for rehab for patient. CSW notified Verlon Au at [facility] of bed acceptance. CSW will continue to follow for discharge planning.   Ruthe Mannan MSW, [address] [phone]

## 2018-05-10 ENCOUNTER — Inpatient Hospital Stay: Payer: Medicare Other

## 2018-05-10 ENCOUNTER — Encounter: Payer: Self-pay | Admitting: Radiology

## 2018-05-10 DIAGNOSIS — I499 Cardiac arrhythmia, unspecified: Secondary | ICD-10-CM

## 2018-05-10 DIAGNOSIS — R000 Tachycardia, unspecified: Secondary | ICD-10-CM

## 2018-05-10 LAB — COMPREHENSIVE METABOLIC PANEL
ALT: 19 U/L (ref 0–44)
AST: 20 U/L (ref 15–41)
Albumin: 3.1 g/dL — ABNORMAL LOW (ref 3.5–5.0)
Alkaline Phosphatase: 47 U/L (ref 38–126)
Anion gap: 9 (ref 5–15)
BUN: 14 mg/dL (ref 8–23)
CHLORIDE: 104 mmol/L (ref 98–111)
CO2: 22 mmol/L (ref 22–32)
Calcium: 8.4 mg/dL — ABNORMAL LOW (ref 8.9–10.3)
Creatinine, Ser: 0.83 mg/dL (ref 0.44–1.00)
GFR calc Af Amer: >60 mL/min (ref >60)
GFR, EST NON AFRICAN AMERICAN: 59 mL/min — AB (ref >60)
GLUCOSE: 210 mg/dL — AB (ref 70–99)
Potassium: 4.6 mmol/L (ref 3.5–5.1)
Sodium: 135 mmol/L (ref 135–145)
TOTAL PROTEIN: 5.9 g/dL — AB (ref 6.5–8.1)
Total Bilirubin: 1.3 mg/dL — ABNORMAL HIGH (ref 0.3–1.2)

## 2018-05-10 LAB — CBC
HEMATOCRIT: 43 % (ref 36.0–46.0)
HEMOGLOBIN: 14.5 g/dL (ref 12.0–15.0)
MCH: 30.2 pg (ref 26.0–34.0)
MCHC: 33.7 g/dL (ref 30.0–36.0)
MCV: 89.6 fL (ref 80.0–100.0)
NRBC: 0 % (ref 0.0–0.2)
Platelets: 278 10*3/uL (ref 150–400)
RBC: 4.8 MIL/uL (ref 3.87–5.11)
RDW: 13 % (ref 11.5–15.5)
WBC: 15.3 10*3/uL — ABNORMAL HIGH (ref 4.0–10.5)

## 2018-05-10 LAB — TROPONIN I: Troponin I: 0.03 ng/mL (ref <0.03)

## 2018-05-10 LAB — MAGNESIUM: Magnesium: 1.6 mg/dL — ABNORMAL LOW (ref 1.7–2.4)

## 2018-05-10 LAB — GLUCOSE, CAPILLARY: Glucose-Capillary: 149 mg/dL — ABNORMAL HIGH (ref 70–99)

## 2018-05-10 LAB — PHOSPHORUS: Phosphorus: 3.7 mg/dL (ref 2.5–4.6)

## 2018-05-10 MED ORDER — AMIODARONE HCL IN DEXTROSE 360-4.14 MG/200ML-% IV SOLN
30.0000 mg/h | INTRAVENOUS | Status: DC
  Administered 2018-05-11 (×3): 30 mg/h via INTRAVENOUS
  Filled 2018-05-10 (×4): qty 200

## 2018-05-10 MED ORDER — MAGNESIUM SULFATE 4 GM/100ML IV SOLN
4.0000 g | Freq: Once | INTRAVENOUS | Status: AC
  Administered 2018-05-11: 4 g via INTRAVENOUS
  Filled 2018-05-10: qty 100

## 2018-05-10 MED ORDER — METOPROLOL TARTRATE 5 MG/5ML IV SOLN
5.0000 mg | INTRAVENOUS | Status: DC | PRN
  Administered 2018-05-10: 16:00:00 5 mg via INTRAVENOUS

## 2018-05-10 MED ORDER — IOHEXOL 300 MG/ML  SOLN
75.0000 mL | Freq: Once | INTRAMUSCULAR | Status: AC | PRN
  Administered 2018-05-10: 13:00:00 75 mL via INTRAVENOUS

## 2018-05-10 MED ORDER — METOPROLOL TARTRATE 5 MG/5ML IV SOLN
INTRAVENOUS | Status: AC
  Filled 2018-05-10: qty 5

## 2018-05-10 MED ORDER — METOPROLOL TARTRATE 5 MG/5ML IV SOLN
5.0000 mg | Freq: Once | INTRAVENOUS | Status: AC
  Administered 2018-05-10: 5 mg via INTRAVENOUS
  Filled 2018-05-10: qty 5

## 2018-05-10 MED ORDER — AMIODARONE LOAD VIA INFUSION
150.0000 mg | Freq: Once | INTRAVENOUS | Status: AC
  Administered 2018-05-10: 150 mg via INTRAVENOUS
  Filled 2018-05-10 (×5): qty 83.34

## 2018-05-10 MED ORDER — DILTIAZEM HCL-DEXTROSE 100-5 MG/100ML-% IV SOLN (PREMIX)
5.0000 mg/h | INTRAVENOUS | Status: DC
  Administered 2018-05-10: 5 mg/h via INTRAVENOUS
  Filled 2018-05-10: qty 100

## 2018-05-10 MED ORDER — DILTIAZEM HCL 30 MG PO TABS
60.0000 mg | ORAL_TABLET | Freq: Four times a day (QID) | ORAL | Status: DC
  Administered 2018-05-11 – 2018-05-13 (×9): 60 mg via ORAL
  Filled 2018-05-10 (×2): qty 2
  Filled 2018-05-10 (×2): qty 1
  Filled 2018-05-10 (×2): qty 2
  Filled 2018-05-10 (×2): qty 1

## 2018-05-10 MED ORDER — DEXTROSE-NACL 5-0.9 % IV SOLN
INTRAVENOUS | Status: DC
  Administered 2018-05-13: 01:00:00 via INTRAVENOUS

## 2018-05-10 MED ORDER — SODIUM CHLORIDE 0.9 % IV SOLN
1000.0000 mL | Freq: Once | INTRAVENOUS | Status: AC
  Administered 2018-05-10: 1000 mL via INTRAVENOUS

## 2018-05-10 MED ORDER — AMIODARONE HCL IN DEXTROSE 360-4.14 MG/200ML-% IV SOLN
60.0000 mg/h | INTRAVENOUS | Status: AC
  Administered 2018-05-10 (×2): 60 mg/h via INTRAVENOUS
  Filled 2018-05-10 (×2): qty 200

## 2018-05-10 NOTE — Progress Notes (Signed)
Patient ID: Jenny Molina, female   DOB: 23-Mar-1924, 84 y.o.   MRN: [id number]  ACP note Patient unable to participate in conversation Son at the bedside along with other family members  Diagnosis: Acute encephalopathy, acute CVA with carotid thrombosis, hypertension, abdominal pain, hyperlipidemia, hypothyroidism.  Patient already a DNR  Explained that everything will key off the mental status.  If she does not regain mental status and able to eat safely then we are talking about hospice.  Patient has a large stroke with right-sided paralysis and likely progression of the stroke.  Repeat CT scan of the head.  CT scan of the abdomen for abdominal pain.  Time spent on ACP discussion 17 minutes Dr. Alford Highland

## 2018-05-10 NOTE — Progress Notes (Signed)
   05/10/18 2200  Clinical Encounter Type  Visited With Family;Patient  Visit Type Follow-up  Spiritual Encounters  Spiritual Needs Emotional   Chaplain followed up with family, son Jillyn Hidden in waiting room.  He expressed hopes regarding patient stabilization and improvement.  Chaplain walked with son back onto unit into patient's room.  Chaplain reminded them of ongoing availability and then left to allow them time together.

## 2018-05-10 NOTE — Progress Notes (Signed)
PT Cancellation Note  Patient Details Name: Jenny Molina MRN: [id number] DOB: 1923-08-02   Cancelled Treatment:    Reason Eval/Treat Not Completed: Patient's level of consciousness.  MD in with pt and RN reporting that pt is still somnolent and inappropriate for PT.  Will re-attempt later if time allows.   Glenetta Hew, PT, DPT 05/10/2018, 10:44 AM

## 2018-05-10 NOTE — Plan of Care (Signed)
  Problem: Education: Goal: Knowledge of disease or condition will improve Outcome: Progressing Goal: Knowledge of secondary prevention will improve Outcome: Progressing Goal: Knowledge of patient specific risk factors addressed and post discharge goals established will improve Outcome: Progressing   Problem: Coping: Goal: Will verbalize positive feelings about self Outcome: Progressing Goal: Will identify appropriate support needs Outcome: Progressing   Problem: Health Behavior/Discharge Planning: Goal: Ability to manage health-related needs will improve Outcome: Progressing   Problem: Health Behavior/Discharge Planning: Goal: Ability to manage health-related needs will improve Outcome: Progressing   Problem: Self-Care: Goal: Ability to participate in self-care as condition permits will improve Outcome: Progressing Goal: Ability to communicate needs accurately will improve Outcome: Progressing   Problem: Nutrition: Goal: Risk of aspiration will decrease Outcome: Progressing   Problem: Ischemic Stroke/TIA Tissue Perfusion: Goal: Complications of ischemic stroke/TIA will be minimized Outcome: Progressing   Problem: Pain Managment: Goal: General experience of comfort will improve Outcome: Progressing   Problem: Safety: Goal: Ability to remain free from injury will improve Outcome: Progressing   Problem: Education: Goal: Knowledge of General Education information will improve Description Including pain rating scale, medication(s)/side effects and non-pharmacologic comfort measures Outcome: Progressing   Problem: Clinical Measurements: Goal: Ability to maintain clinical measurements within normal limits will improve Outcome: Progressing   Problem: Skin Integrity: Goal: Risk for impaired skin integrity will decrease Outcome: Progressing

## 2018-05-10 NOTE — Progress Notes (Signed)
Patient ID: Jenny Molina, female   DOB: Oct 13, 1923, 84 y.o.   MRN: [id number]  [employer] PROGRESS NOTE  ELLINOR TEST WUJ:[id number] DOB: December 20, 1923 DOA: 05/06/2018 PCP: Marguarite Arbour, MD  HPI/Subjective: Patient less responsive today.  Cannot move her right side.  Son noticed that she is reaching over to her chest for pain.  Patient does have some abdominal pain when palpating.  Unable to give any history at this time.  Objective: Vitals:   05/10/18 0438 05/10/18 0900  BP: (!) 154/78 (!) 174/94  Pulse: 77 (!) 104  Resp: 18 16  Temp: 97.7 F (36.5 C) (!) 97.3 F (36.3 C)  SpO2: 97% 98%    Filed Weights   05/06/18 1414 05/06/18 1826  Weight: 68 kg 51.8 kg    ROS: Review of Systems  Unable to perform ROS: Mental status change   Exam: Physical Exam  Constitutional: She appears lethargic.  HENT:  Nose: No mucosal edema.  Mouth/Throat: No oropharyngeal exudate.  Eyes: Pupils are equal, round, and reactive to light. Conjunctivae and lids are normal.  Neck: Carotid bruit is not present.  Cardiovascular: Regular rhythm, S1 normal, S2 normal and normal heart sounds.  Respiratory: She has decreased breath sounds in the right lower field and the left lower field. She has no wheezes. She has no rhonchi. She has no rales.  GI: Soft. Bowel sounds are normal. There is tenderness.  Musculoskeletal:       Right ankle: She exhibits no swelling.       Left ankle: She exhibits no swelling.  Neurological: She appears lethargic.  Right-sided paralysis  Skin: Skin is warm. No rash noted.  Psychiatric:  Lethargic      Data Reviewed: Basic Metabolic Panel: Recent Labs  Lab 05/06/18 1419 05/08/18 0608 05/09/18 0523  NA 139 142 139  K 3.4* 2.8* 3.6  CL 102 108 105  CO2 22 21* 27  GLUCOSE 126* 67* 147*  BUN 21 15 10   CREATININE 0.63 0.55 0.48  CALCIUM 9.2 7.8* 8.3*  MG  --  1.8  --   PHOS  --  2.5  --    Liver Function Tests: Recent Labs  Lab 05/06/18 1419   AST 61*  ALT 28  ALKPHOS 58  BILITOT 2.0*  PROT 7.1  ALBUMIN 4.1   CBC: Recent Labs  Lab 05/06/18 1419 05/08/18 0608  WBC 15.2* 8.7  NEUTROABS 13.6* 6.9  HGB 15.5* 12.8  HCT 44.8 37.9  MCV 88.4 90.0  PLT 246 192   Cardiac Enzymes: Recent Labs  Lab 05/06/18 1419 05/07/18 0430 05/08/18 0608  CKTOTAL 1,678* 539* 213  TROPONINI 0.03*  --  0.04*    CBG: Recent Labs  Lab 05/07/18 2105 05/07/18 2359 05/08/18 0602 05/08/18 0645 05/09/18 2052  GLUCAP 68* 71 69* 155* 155*     Scheduled Meds: . amLODipine  5 mg Oral Daily  . aspirin  300 mg Rectal Daily   Or  . aspirin  325 mg Oral Daily  . atorvastatin  40 mg Oral q1800  . enoxaparin (LOVENOX) injection  40 mg Subcutaneous Q24H  . levothyroxine  50 mcg Oral QAC breakfast   Continuous Infusions: . dextrose 5 % and 0.9 % NaCl with KCl 40 mEq/L 100 mL/hr at 05/10/18 0927  . lacosamide (VIMPAT) IV 100 mg (05/10/18 0418)    Assessment/Plan:  1. Acute encephalopathy.  Repeat CT scan of the head.  Could be progression of stroke.  Everything will key off the  mental status including prognosis.  Patient already a DNR.  Mention the potential of hospice.  Case discussed with neurology Dr. Loretha Brasil to follow-up CAT scan of the head. 2. Acute CVA likely thrombus in left carotid artery.  On aspirin. 3. Abdominal pain.  CT abdomen and pelvis. 4. Hypertension on amlodipine 5. Hyperlipidemia on atorvastatin 6. Hypothyroidism unspecified on levothyroxine 7. Looks like patient empirically placed on Vimpat  Code Status:     Code Status Orders  (From admission, onward)         Start     Ordered   05/06/18 2126  Do not attempt resuscitation (DNR)  Continuous    Question Answer Comment  In the event of cardiac or respiratory ARREST Do not call a "code blue"   In the event of cardiac or respiratory ARREST Do not perform Intubation, CPR, defibrillation or ACLS   In the event of cardiac or respiratory ARREST Use medication  by any route, position, wound care, and other measures to relive pain and suffering. May use oxygen, suction and manual treatment of airway obstruction as needed for comfort.      05/06/18 2125        Code Status History    Date Active Date Inactive Code Status Order ID Comments User Context   05/06/2018 1626 05/06/2018 2125 Full Code [id number]  Milagros Loll, MD ED    Advance Directive Documentation     Most Recent Value  Type of Advance Directive  Healthcare Power of Attorney, Living will  Pre-existing out of facility DNR order (yellow form or pink MOST form)  -  "MOST" Form in Place?  -     Family Communication: Spoke with son and family at the bedside Disposition Plan: To be determined based on mental status  Consultants:  Neurology  Time spent: 28 minutes including ACP time  [employer]

## 2018-05-10 NOTE — Consult Note (Signed)
PULMONARY / CRITICAL CARE MEDICINE  Name: Jenny Molina MRN: [id number] DOB: 05-02-1924    LOS: 4  Referring Provider: Dr. Renae Gloss Reason for Referral: A. fib with RVR versus SVT Brief patient description: 84 year old female admitted with acute CVA being transferred to the ICU for new onset cardiac arrhythmia with an elevated heart rate in the 160s. 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  Response was called because patient's heart rate jumped into the 160s.  She was given pushes of metoprolol and started on a diltiazem drip without improvement in heart rate.  She also became hypotensive with a systolic blood pressure of 89.  She was transferred to the ICU for further management.  Labs showed a magnesium level of 1.6, WBC of 15.3 and a blood glucose level of 210.  She was given 4 g of magnesium and an amiodarone bolus with conversion into sinus rhythm.  Past Medical History:  Diagnosis Date  . HTN (hypertension)   . Hypothyroidism    History reviewed. No pertinent surgical history. Prior to Admission medications   Medication Sig Start Date End Date Taking? Authorizing Provider  amLODipine (NORVASC) 5 MG tablet Take 5 mg by mouth daily.   Yes [provider]  clopidogrel (PLAVIX) 75 MG tablet Take 75 mg by mouth daily.   Yes [provider]  donepezil (ARICEPT) 5 MG tablet Take 1 tablet (5 mg total) by mouth at bedtime. 03/10/18 04/19/18 Yes Sowles, Danna Hefty, MD  empagliflozin (JARDIANCE) 25 MG TABS tablet Take 25 mg by mouth daily.   Yes [provider]  glycopyrrolate (ROBINUL) 1 MG tablet Take 1 mg by mouth 2 (two) times daily.   Yes [provider]  insulin aspart (NOVOLOG FLEXPEN) 100 UNIT/ML FlexPen Inject 12 Units into the skin 2 (two) times daily.   Yes [provider]  insulin aspart (NOVOLOG) 100 UNIT/ML FlexPen Inject 18 Units into the skin daily. At 1700   Yes [provider]  Insulin Degludec-Liraglutide (XULTOPHY) 100-3.6 UNIT-MG/ML SOPN Inject 50 Units into the skin daily.   Yes [provider]  levETIRAcetam (KEPPRA) 500 MG tablet Take 500 mg by mouth 2 (two) times daily.   Yes [provider]  lipase/protease/amylase (CREON) 12000 units CPEP capsule Take 6,000 Units by mouth 3 (three) times daily before meals.   Yes [provider]  lipase/protease/amylase (CREON) 12000 units CPEP capsule Take 3,000 Units by mouth at bedtime. With snack   Yes [provider]  lisinopril (PRINIVIL,ZESTRIL) 5 MG tablet Take 5 mg by mouth daily.   Yes [provider]  metoprolol succinate (TOPROL-XL) 25 MG 24 hr tablet Take 1 tablet (25 mg total) by mouth daily. 03/10/18  Yes Sowles, Danna Hefty, MD  rosuvastatin (CRESTOR) 40 MG tablet Take 1 tablet (40 mg total) by mouth daily. 03/10/18 04/19/18 Yes Alba Cory, MD  aspirin EC 81 MG tablet Take 81 mg by mouth daily.    [provider]  famotidine (PEPCID) 20 MG tablet Take 1 tablet (20 mg total) by mouth 2 (two) times daily. 03/10/18 04/09/18  Alba Cory, MD  gabapentin (NEURONTIN) 300 MG capsule Take 1 capsule (300 mg total) by mouth 2 (two) times daily. 03/10/18 04/09/18  Alba Cory, MD  insulin glargine (LANTUS) 100 UNIT/ML injection Inject 0.1 mLs (10 Units total) into the skin daily. 03/10/18 04/09/18  Alba Cory, MD  lacosamide 100 MG TABS Take 1 tablet (100 mg total) by mouth 2 (two) times daily. Patient not taking: Reported on 04/19/2018 07/26/17   Enedina Finner, MD  promethazine (PHENERGAN) 12.5 MG tablet Take 1 tablet (12.5 mg total) by mouth every 6 (six) hours as needed for nausea or  vomiting. Patient not taking: Reported on 04/19/2018 05/14/17   Almond Lint, MD  sertraline (ZOLOFT) 25 MG tablet Take 1 tablet (25 mg total) by mouth daily. Patient not taking: Reported on 04/19/2018 03/10/18   Alba Cory, MD   Allergies Allergies  Allergen Reactions  . Benicar [Olmesartan]   . Ciprofloxacin   . Hydralazine     Feels funny  . Hydrochlorothiazide   . Penicillins   . Sulfa Antibiotics     Sulfa drugs    Family History Family History  Problem Relation Age of Onset  . Hypertension Mother   . CAD Father    Social History  reports that she has never smoked. She has never used smokeless tobacco. She reports that she drank alcohol. She reports that she has current or past drug history.  Review Of Systems: Unable to obtain as patient is aphasic  VITAL SIGNS: BP 100/64   Pulse (!) 167   Temp (!) 94.7 F (34.8 C) (Axillary)   Resp (!) 27   Ht 5\' 5"  (1.651 m)   Wt 51.1 kg   SpO2 93%   BMI 18.75 kg/m   HEMODYNAMICS:    VENTILATOR SETTINGS:    INTAKE / OUTPUT: I/O last 3 completed shifts: In: 2227.7 [P.O.:480; I.V.:1744.4; IV Piggyback:3.3] Out: 3200 [Urine:3200]  PHYSICAL EXAMINATION: General: Lying in bed, awake, in no distress HEENT: PERRLA, trachea midline, no JVD Neuro: Awake, nonverbal, will occasionally moan and groan, moves extremities sluggishly Cardiovascular: Apical pulse regular, S1-S2, no murmur regurg or gallop, +2 pulses bilaterally, no edema Lungs: Clear to auscultation bilaterally Abdomen: Nondistended, normal bowel sounds in all 4 quadrants Musculoskeletal: Positive range of motion Skin: Warm and dry  LABS:  BMET Recent Labs  Lab 05/06/18 1419 05/08/18 0608 05/09/18 0523  NA 139 142 139  K 3.4* 2.8* 3.6  CL 102 108 105  CO2 22 21* 27  BUN 21 15 10   CREATININE 0.63 0.55 0.48  GLUCOSE 126* 67* 147*    Electrolytes Recent Labs  Lab 05/06/18 1419 05/08/18 0608 05/09/18 0523 05/10/18 2032  CALCIUM 9.2 7.8* 8.3*   --   MG  --  1.8  --  1.6*  PHOS  --  2.5  --   --     CBC Recent Labs  Lab 05/06/18 1419 05/08/18 0608 05/10/18 2129  WBC 15.2* 8.7 15.3*  HGB 15.5* 12.8 14.5  HCT 44.8 37.9 43.0  PLT 246 192 278    Coag's Recent Labs  Lab 05/06/18 1419  APTT 32  INR 1.18    Sepsis Markers No results for input(s): LATICACIDVEN, PROCALCITON, O2SATVEN in the last 168 hours.  ABG No results for input(s): PHART, PCO2ART, PO2ART in the last 168 hours.  Liver Enzymes Recent Labs  Lab 05/06/18 1419  AST 61*  ALT 28  ALKPHOS 58  BILITOT 2.0*  ALBUMIN 4.1    Cardiac Enzymes Recent Labs  Lab 05/06/18 1419 05/08/18 0608  TROPONINI 0.03* 0.04*    Glucose Recent Labs  Lab 05/07/18 2105 05/07/18 2359 05/08/18 0602  05/08/18 0645 05/09/18 2052 05/10/18 1855  GLUCAP 68* 71 69* 155* 155* 149*    Imaging Ct Head Wo Contrast  Result Date: 05/10/2018 CLINICAL DATA:  84 year old female with distal left ICA thrombosis, left lentiform, internal capsule and medial left temporal lobe infarct. EXAM: CT HEAD WITHOUT CONTRAST TECHNIQUE: Contiguous axial images were obtained from the base of the skull through the vertex without intravenous contrast. COMPARISON:  CTA neck and brain MRI 05/07/2018.  Head CT 05/06/2018. FINDINGS: Brain: Hypodensity from the mesial left temporal lobe through the left lentiform nuclei tracking toward the left corona radiata corresponds well to the restricted diffusion on 05/07/2018. No associated hemorrhage or mass effect. Patchy white matter hypodensity elsewhere appears stable. No midline shift, ventriculomegaly, mass effect, evidence of mass lesion, or intracranial hemorrhage. Vascular: Stable noncontrast CT appearance. Skull: Negative. Sinuses/Orbits: Visualized paranasal sinuses and mastoids are stable and well pneumatized. Other: Stable orbit and scalp soft tissues. IMPRESSION: 1. Stable/expected appearance of the deep left MCA territory infarct seen by MRI on  05/07/2018. No associated hemorrhage or mass effect. 2. No new intracranial abnormality. Electronically Signed   By: Odessa Fleming M.D.   On: 05/10/2018 13:44   Ct Abdomen Pelvis W Contrast  Result Date: 05/10/2018 CLINICAL DATA:  84 year old female with recent left MCA cerebral infarct. Abdominal pain. EXAM: CT ABDOMEN AND PELVIS WITH CONTRAST TECHNIQUE: Multidetector CT imaging of the abdomen and pelvis was performed using the standard protocol following bolus administration of intravenous contrast. CONTRAST:  75mL OMNIPAQUE IOHEXOL 300 MG/ML  SOLN COMPARISON:  Pelvis CT 05/06/2018.  CT chest and abdomen 05/06/2014 FINDINGS: Lower chest: Cardiomegaly appears mildly progressed since 2015. No pericardial effusion. Small layering right pleural effusion. Trace layering left pleural effusion. Mild compressive atelectasis at both lung bases. Hepatobiliary: Benign hepatic cysts are stable since 2015. There may be some vicarious excretion of contrast to the gallbladder. No pericholecystic inflammation. Central hepatic bile ducts appear stable since 2015. Pancreas: Negative. Spleen: Negative. Adrenals/Urinary Tract: Normal adrenal glands. The kidneys appear stable and within normal limits. Renal enhancement and contrast excretion is symmetric. Proximal ureters are decompressed. The urinary bladder is distended despite the presence of a Foley catheter. Estimated bladder volume 564 milliliters. Small volume of gas within the bladder. No perivesical stranding. Stomach/Bowel: Mild presacral stranding and air-fluid level in the rectum but no rectal wall thickening. Diverticulosis in the proximal sigmoid colon is moderate, but there is no active inflammation identified. Redundant proximal sigmoid. Negative descending colon. Negative transverse colon. Redundant flexures. Negative right colon. The cecum is on a lax mesentery. There appears to be a normal appendix on sagittal image 39. Decompressed and negative terminal ileum. No  dilated small bowel. Negative stomach and duodenum. No abdominal free air.  No abdominal free fluid. Vascular/Lymphatic: Aortoiliac calcified atherosclerosis. Ectasia of the infrarenal abdominal aorta has mildly progressed, now 25 millimeters diameter. Major arterial structures are patent. Portal venous system is patent. No lymphadenopathy. Reproductive: Negative. Other: Small volume pelvic free fluid with simple fluid density on the right (series 2, image 64). Musculoskeletal: No acute osseous abnormality identified. IMPRESSION: 1. A small volume of pelvic free fluid with presacral stranding is abnormal but nonspecific. There is no convincing bowel inflammation or other inflammatory process in the abdomen or pelvis. 2. Distended urinary bladder despite the presence of a Foley catheter. Estimated bladder volume 564 mL. 3. Small layering pleural effusions with atelectasis greater on the right. 4. Sigmoid diverticulosis without active inflammation. 5. Aortic Atherosclerosis (ICD10-I70.0) and ectatic abdominal aorta at  risk for aneurysm development. Recommend followup by Ultrasound in 5 years. This recommendation follows ACR consensus guidelines: White Paper of the ACR Incidental Findings Committee II on Vascular Findings. J Am Coll Radiol 2013; 10:789-794. Electronically Signed   By: Odessa Fleming M.D.   On: 05/10/2018 13:53    STUDIES:  2D echo on May 07, 2018 showed an EF of 55 to 60% EEG on 05/07/2018  CULTURES: None  ANTIBIOTICS: None  SIGNIFICANT EVENTS: 05/06/2018: Admitted with acute CVA with residual right hemiplegia and expressive aphasia 05/10/2018: Transferred to the ICU for A. fib with RVR  LINES/TUBES: Peripheral IVs DISCUSSION: 83 year old female presenting with new onset A. fib with RVR and acute CVA.;  Now in sinus rhythm she is post amiodarone infusion  ASSESSMENT  Acute cerebral infarct involving the left lentiform nucleus, posterior limb of the internal capsule and the medial  left temporal lobe Acute internal carotid artery thrombosis New onset arrhythmia- A. fib with RVR versus SVT Hypomagnesemia History of hypertension History of hypothyroidism   PLAN Hemodynamic monitoring per ICU protocol Neurochecks per neurology Amiodarone bolus followed by infusion Magnesium 4 g IV x1 IV fluid bolus diet x1 DC diltiazem infusion Repeat labs Stat EKG-reviewed-now in sinus rhythm Follow-up with neurology PT OT eval Echo cardiac enzymes Cardiology consult   Best Practice:  Code Status: DNR Diet: Dysphagia 1 GI prophylaxis: Not indicated VTE prophylaxis: Subcu Lovenox  FAMILY  - Updates: Patient's daughter and son updated at bedside  Edilia Ghuman [facility] ANP-BC Pulmonary and Critical Care Medicine [facility] Pager [phone] or [phone]  NB: This document was prepared using Dragon voice recognition software and may include unintentional dictation errors.    05/10/2018, 9:46 PM

## 2018-05-10 NOTE — Progress Notes (Signed)
PT Cancellation Note  Patient Details Name: Jenny Molina MRN: [id number] DOB: 11/03/1923   Cancelled Treatment:    Reason Eval/Treat Not Completed: Medical issues which prohibited therapy(Patient noted with transfer to CCU due to uncontrolled HR,  Per policy, will require new orders to resume PT services.  Please re-consult as medically appropriate.)   Steffan Caniglia H. Manson Passey, PT, DPT, NCS 05/10/18, 11:11 PM [phone]

## 2018-05-10 NOTE — Progress Notes (Signed)
Elink notified of pt condition. HR remains in high 160's Systolic is improving, currently 100. Cardizem currently running at 10 mg/hr. Elink acknowledged will call back with further instruction. Will continue to assess

## 2018-05-10 NOTE — Progress Notes (Signed)
   05/10/18 1915  Clinical Encounter Type  Visited With Patient;Family  Visit Type Follow-up;Spiritual support  Spiritual Encounters  Spiritual Needs Emotional;Prayer   While rounding on unit, chaplain stopped in to check on patient.  Chaplain maintained a calming presence.  Patient attempted to engage, but chaplain had some difficulty in understanding all of patient's words.  Patient did request prayer which chaplain led.  Chaplain utilized close and empathic listening as patient spoke of feelings regarding health and concern for family.  Chaplain gathered and clarified that at one point patient began praying the Lord's Prayer which chaplain then prayed with patient.  While leaving unit, chaplain was alerted that patient's family was in waiting room.  Chaplain offered active and reflective listening as well as ministry of presence as family reviewed events that had brought patient to the hospital and feelings regarding waiting to see what the night would hold.  Patient's daughter requested prayer with chaplain which chaplain led.  Chaplain to continue to follow patient and family, but also encouraged them to reach out as needed.

## 2018-05-10 NOTE — Progress Notes (Addendum)
Patient's heart rate in 190's sustaining, notified Dr. Renae Gloss, received verbal order for metoprolol 5mg  IV one time for HR above 140. Medication given, HR down to 170, notified Dr. Renae Gloss a second time, received an additional order for metoprolol 5mg , Dr. Renae Gloss gave verbal order to transfer patient to ICU.

## 2018-05-10 NOTE — Progress Notes (Signed)
Patient ID: Jenny Molina, female   DOB: 20-Aug-1923, 84 y.o.   MRN: [id number]    [facility] PROGRESS NOTE  DERRICKA MERTZ WUJ:[id number] DOB: 1924-05-07 DOA: 05/06/2018 PCP: Marguarite Arbour, MD  HPI/Subjective: Called to come back and see the patient secondary to heart rate being elevated.  We tried a couple pushes of metoprolol without luck.  Not sure if the patient will be able to take oral medications at this point  Objective: Vitals:   05/10/18 1559 05/10/18 1604  BP: 99/83 (!) 89/71  Pulse: (!) 193 (!) 163  Resp:    Temp: (!) 97.5 F (36.4 C)   SpO2: 97%     Filed Weights   05/06/18 1414 05/06/18 1826  Weight: 68 kg 51.8 kg    ROS: Review of Systems  Unable to perform ROS: Mental status change   Exam: Physical Exam  Constitutional: She appears lethargic.  HENT:  Nose: No mucosal edema.  Mouth/Throat: No oropharyngeal exudate.  Eyes: Pupils are equal, round, and reactive to light. Conjunctivae and lids are normal.  Neck: Carotid bruit is not present.  Cardiovascular: Regular rhythm, S1 normal, S2 normal and normal heart sounds.  Respiratory: She has decreased breath sounds in the right lower field and the left lower field. She has no wheezes. She has no rhonchi. She has no rales.  GI: Soft. Bowel sounds are normal. There is tenderness.  Musculoskeletal:       Right ankle: She exhibits no swelling.       Left ankle: She exhibits no swelling.  Neurological: She appears lethargic.  Right-sided paralysis  Skin: Skin is warm. No rash noted.  Psychiatric:  Lethargic      Data Reviewed: Basic Metabolic Panel: Recent Labs  Lab 05/06/18 1419 05/08/18 0608 05/09/18 0523  NA 139 142 139  K 3.4* 2.8* 3.6  CL 102 108 105  CO2 22 21* 27  GLUCOSE 126* 67* 147*  BUN 21 15 10   CREATININE 0.63 0.55 0.48  CALCIUM 9.2 7.8* 8.3*  MG  --  1.8  --   PHOS  --  2.5  --    Liver Function Tests: Recent Labs  Lab 05/06/18 1419  AST 61*  ALT 28  ALKPHOS 58   BILITOT 2.0*  PROT 7.1  ALBUMIN 4.1   CBC: Recent Labs  Lab 05/06/18 1419 05/08/18 0608  WBC 15.2* 8.7  NEUTROABS 13.6* 6.9  HGB 15.5* 12.8  HCT 44.8 37.9  MCV 88.4 90.0  PLT 246 192   Cardiac Enzymes: Recent Labs  Lab 05/06/18 1419 05/07/18 0430 05/08/18 0608  CKTOTAL 1,678* 539* 213  TROPONINI 0.03*  --  0.04*    CBG: Recent Labs  Lab 05/07/18 2105 05/07/18 2359 05/08/18 0602 05/08/18 0645 05/09/18 2052  GLUCAP 68* 71 69* 155* 155*     Scheduled Meds: . metoprolol tartrate      . aspirin  300 mg Rectal Daily   Or  . aspirin  325 mg Oral Daily  . atorvastatin  40 mg Oral q1800  . diltiazem  60 mg Oral Q6H  . enoxaparin (LOVENOX) injection  40 mg Subcutaneous Q24H  . levothyroxine  50 mcg Oral QAC breakfast   Continuous Infusions: . dextrose 5 % and 0.9% NaCl 30 mL/hr at 05/10/18 1337  . diltiazem (CARDIZEM) infusion      Assessment/Plan:  1. SVT and hypotension.  Metoprolol 5 mg IV push every hour as needed, transfer to the CCU stepdown for Cardizem drip.  Try oral Cardizem.  Case discussed with critical care specialist.   2. Acute encephalopathy.  Repeat CT scan of the head no change from previous.  Daughter states could be the Vimpat so I will get rid of this medication. 3. Acute CVA likely thrombus in left carotid artery, right-sided paralysis.  On aspirin. 4. Abdominal pain.  CT abdomen pelvis showed dilated bladder and nurse manipulated the Foley and it drained out.  Abdominal pain resolved. 5. Hypertension on amlodipine 6. Hyperlipidemia on atorvastatin 7. Hypothyroidism unspecified on levothyroxine  Code Status:     Code Status Orders  (From admission, onward)         Start     Ordered   05/06/18 2126  Do not attempt resuscitation (DNR)  Continuous    Question Answer Comment  In the event of cardiac or respiratory ARREST Do not call a "code blue"   In the event of cardiac or respiratory ARREST Do not perform Intubation, CPR,  defibrillation or ACLS   In the event of cardiac or respiratory ARREST Use medication by any route, position, wound care, and other measures to relive pain and suffering. May use oxygen, suction and manual treatment of airway obstruction as needed for comfort.      05/06/18 2125        Code Status History    Date Active Date Inactive Code Status Order ID Comments User Context   05/06/2018 1626 05/06/2018 2125 Full Code [id number]  Milagros Loll, MD ED    Advance Directive Documentation     Most Recent Value  Type of Advance Directive  Healthcare Power of Attorney, Living will  Pre-existing out of facility DNR order (yellow form or pink MOST form)  -  "MOST" Form in Place?  -     Family Communication: Spoke with son and daughter and family at the bedside Disposition Plan: To be determined based on mental status  Consultants:  Neurology  Critical care specialist  Time spent: Another 35 minutes.  Case discussed with critical care specialist  Bladen Umar [employer]

## 2018-05-10 NOTE — Progress Notes (Signed)
OT Cancellation Note  Patient Details Name: MARIALY URBANCZYK MRN: [id number] DOB: [birth year]   Cancelled Treatment:   Patient's level of consciousness.  Per PT, MD in with pt and RN reporting that pt is still somnolent and inappropriate for OT.  Will re-attempt later if time allows.     Kirstie Peri, OTR/L  Mikaylee Arseneau L 05/10/2018, 4:02 PM

## 2018-05-10 NOTE — Progress Notes (Signed)
eLink Physician-Brief Progress Note Patient Name: Jenny Molina DOB: [birth year] MRN: [id number]   Date of Service  05/10/2018  HPI/Events of Note  AFIB with RVR - Ventricular rate = 169. BP = 107/90. Presently on Cardizem IV infusion. K+ replacement in progress.   eICU Interventions  Will order: 1. Mg++ level STAT. 2. Amiodarone IV load and infusion.  3. Ground team notified of new consultation and need to evaluate patient at bedside.      Intervention Category Major Interventions: Arrhythmia - evaluation and management  Sommer,Steven Eugene 05/10/2018, 8:28 PM

## 2018-05-10 NOTE — Progress Notes (Signed)
Received request to reassess Pt. as status has changed. Lengthy discussion with Pt's son regarding change in status. Pt has eaten today and continues to tolerate the pureed foods and nectar thick liquids. Son has been thoroughly educated on Pt's compensatory strategies and feeding techniques for safe swallowing. Discussed with nsg who also reports no difficulties. Rec. continue with current dysphagia 1 diet with nectar thick liquids BY TSP. ST to f/u and reassess as needed.

## 2018-05-11 LAB — MAGNESIUM: MAGNESIUM: 2 mg/dL (ref 1.7–2.4)

## 2018-05-11 LAB — CBC
HEMATOCRIT: 41.4 % (ref 36.0–46.0)
HEMOGLOBIN: 14.3 g/dL (ref 12.0–15.0)
MCH: 30.9 pg (ref 26.0–34.0)
MCHC: 34.5 g/dL (ref 30.0–36.0)
MCV: 89.4 fL (ref 80.0–100.0)
Platelets: 224 10*3/uL (ref 150–400)
RBC: 4.63 MIL/uL (ref 3.87–5.11)
RDW: 13 % (ref 11.5–15.5)
WBC: 10.9 10*3/uL — ABNORMAL HIGH (ref 4.0–10.5)
nRBC: 0 % (ref 0.0–0.2)

## 2018-05-11 LAB — MRSA PCR SCREENING: MRSA by PCR: NEGATIVE

## 2018-05-11 LAB — BASIC METABOLIC PANEL
Anion gap: 9 (ref 5–15)
BUN: 13 mg/dL (ref 8–23)
CHLORIDE: 106 mmol/L (ref 98–111)
CO2: 22 mmol/L (ref 22–32)
Calcium: 8.2 mg/dL — ABNORMAL LOW (ref 8.9–10.3)
Creatinine, Ser: 0.63 mg/dL (ref 0.44–1.00)
GFR calc non Af Amer: >60 mL/min (ref >60)
Glucose, Bld: 117 mg/dL — ABNORMAL HIGH (ref 70–99)
POTASSIUM: 4.1 mmol/L (ref 3.5–5.1)
SODIUM: 137 mmol/L (ref 135–145)

## 2018-05-11 LAB — PHOSPHORUS: PHOSPHORUS: 3.5 mg/dL (ref 2.5–4.6)

## 2018-05-11 LAB — TROPONIN I
TROPONIN I: 0.13 ng/mL — AB (ref <0.03)
Troponin I: 0.13 ng/mL (ref <0.03)

## 2018-05-11 LAB — PROTIME-INR
INR: 1.24
PROTHROMBIN TIME: 15.5 s — AB (ref 11.4–15.2)

## 2018-05-11 LAB — PROCALCITONIN

## 2018-05-11 LAB — LACTIC ACID, PLASMA: Lactic Acid, Venous: 1.4 mmol/L (ref 0.5–1.9)

## 2018-05-11 MED ORDER — SODIUM CHLORIDE 0.9 % IV SOLN
100.0000 mg | Freq: Two times a day (BID) | INTRAVENOUS | Status: DC
  Administered 2018-05-11 – 2018-05-13 (×4): 100 mg via INTRAVENOUS
  Filled 2018-05-11 (×5): qty 100

## 2018-05-11 NOTE — Progress Notes (Signed)
   Name: Jenny Molina MRN: [id number] DOB: 07-Oct-1923     CONSULTATION DATE: 05/06/2018 Subjective & objective: Amiodarone drip and started on dysphagia 1 diet.  PAST MEDICAL HISTORY :   has a past medical history of HTN (hypertension) and Hypothyroidism.  has no past surgical history on file. Prior to Admission medications   Medication Sig Start Date End Date Taking? Authorizing Provider  ALPRAZolam Prudy Feeler) 0.5 MG tablet Take 1 tablet by mouth every 8 (eight) hours. 04/01/18  Yes [provider]  amLODipine (NORVASC) 5 MG tablet Take 1 tablet by mouth daily. 01/06/18  Yes [provider]  furosemide (LASIX) 20 MG tablet Take 1 tablet by mouth daily as needed.  04/08/18 04/08/19 Yes [provider]  levothyroxine (SYNTHROID, LEVOTHROID) 50 MCG tablet Take 1 tablet by mouth daily before breakfast. 05/10/17  Yes [provider]  metoprolol (TOPROL-XL) 200 MG 24 hr tablet Take 1 tablet by mouth daily. 05/10/17  Yes [provider]   Allergies  Allergen Reactions  . Benicar [Olmesartan]   . Ciprofloxacin   . Hydralazine     Feels funny  . Hydrochlorothiazide   . Penicillins   . Sulfa Antibiotics     Sulfa drugs    FAMILY HISTORY:  family history includes CAD in her father; Hypertension in her mother. SOCIAL HISTORY:  reports that she has never smoked. She has never used smokeless tobacco. She reports that she drank alcohol. She reports that she has current or past drug history.  REVIEW OF SYSTEMS:   Unable to obtain due to critical illness   VITAL SIGNS: Temp:  [94.7 F (34.8 C)-98.4 F (36.9 C)] 97.8 F (36.6 C) (10/27 0900) Pulse Rate:  [87-169] 95 (10/27 1300) Resp:  [10-29] 19 (10/27 1300) BP: (67-150)/(53-109) 133/87 (10/27 1300) SpO2:  [93 %-98 %] 96 % (10/27 1300) Weight:  [51.1 kg] 51.1 kg (10/26 1900)  Physical Examination:  Awake, lethargic, right facial droop, right-sided flaccid paralysis, slow to respond and  follows simple commands as per nursing.  Mumbled speech and a detailed neuro exam as per neurology Room air, no distress, bilateral equal air entry and left basal medium crackles S1 and S2 are audible with no murmur Benign abdominal exam with feeble peristalsis Wasted extremities and no edema   ASSESSMENT / PLAN: Ischemic CVA with left infarct and left lentiform nucleus posterior limb of internal capsule and medial left temporal lobe.  Acute thrombus internal carotid artery on MRA.  Tolerating dysphagia 1 diet -Antiplatelet for secondary stroke prevention -Supportive care  Atrial flutter with RVR with no evidence of myocardial infarctions or acute coronary syndrome.  Recent echo was normal LV systolic function -Rate control amiodarone and diltiazem -Statin for dyslipidemia -Follow with cardiology  Atelectasis with possible aspiration.  Left lower airspace disease -Empiric Doxycycline as infective etiology cannot be ruled out -Monitor CXR + CBC + FiO2  Hypothyroidism -Optimize levothyroxine and monitor free T4  DNR  Supportive care  Critical care time 35 minutes

## 2018-05-11 NOTE — Consult Note (Signed)
[facility] Cardiology Consultation Note  Patient ID: Jenny WIGGLESWORTH, MRN: [id number], DOB/AGE: [birth year] 84 y.o. Admit date: 05/06/2018   Date of Consult: 05/11/2018 Primary Physician: Marguarite Arbour, MD Primary Cardiologist: Higinio Roger  Chief Complaint:  Chief Complaint  Patient presents with  . Fall   Reason for Consult: Atrial fibrillation  HPI: 84 y.o. female with known essential hypertension mixed hyperlipidemia with acute evidence of stroke and admitted for significant concerns of the stroke.  At that time the patient appears to have had atrial fibrillation and/or atrial flutter with rapid ventricular rate for which show the patient responded to diltiazem and amiodarone combination.  Currently she continues to have atrial flutter with variable rate at 100 bpm and no evidence of significant myocardial infarction or acute coronary syndromes.  Troponin is 0.13 most consistent with demand ischemia.  The patient is not conversing at this time due to significant stroke but has had previous echocardiogram showing normal LV systolic function with no evidence of significant valvular heart disease.  Currently she is hemodynamically stable  Past Medical History:  Diagnosis Date  . HTN (hypertension)   . Hypothyroidism       Surgical History: History reviewed. No pertinent surgical history.   Home Meds: Prior to Admission medications   Medication Sig Start Date End Date Taking? Authorizing Provider  ALPRAZolam Prudy Feeler) 0.5 MG tablet Take 1 tablet by mouth every 8 (eight) hours. 04/01/18  Yes [provider]  amLODipine (NORVASC) 5 MG tablet Take 1 tablet by mouth daily. 01/06/18  Yes [provider]  furosemide (LASIX) 20 MG tablet Take 1 tablet by mouth daily as needed.  04/08/18 04/08/19 Yes [provider]  levothyroxine (SYNTHROID, LEVOTHROID) 50 MCG tablet Take 1 tablet by mouth daily before breakfast. 05/10/17  Yes [provider]  metoprolol  (TOPROL-XL) 200 MG 24 hr tablet Take 1 tablet by mouth daily. 05/10/17  Yes [provider]    Inpatient Medications:  . aspirin  300 mg Rectal Daily   Or  . aspirin  325 mg Oral Daily  . atorvastatin  40 mg Oral q1800  . diltiazem  60 mg Oral Q6H  . enoxaparin (LOVENOX) injection  40 mg Subcutaneous Q24H  . levothyroxine  50 mcg Oral QAC breakfast   . amiodarone 30 mg/hr (05/11/18 0940)  . dextrose 5 % and 0.9% NaCl 30 mL/hr at 05/11/18 0400  . diltiazem (CARDIZEM) infusion Stopped (05/10/18 2118)    Allergies:  Allergies  Allergen Reactions  . Benicar [Olmesartan]   . Ciprofloxacin   . Hydralazine     Feels funny  . Hydrochlorothiazide   . Penicillins   . Sulfa Antibiotics     Sulfa drugs    Social History   Socioeconomic History  . Marital status: Married    Spouse name: Not on file  . Number of children: Not on file  . Years of education: Not on file  . Highest education level: Not on file  Occupational History  . Not on file  Social Needs  . Financial resource strain: Not on file  . Food insecurity:    Worry: Not on file    Inability: Not on file  . Transportation needs:    Medical: Not on file    Non-medical: Not on file  Tobacco Use  . Smoking status: Never Smoker  . Smokeless tobacco: Never Used  Substance and Sexual Activity  . Alcohol use: Not Currently    Frequency: Never  Comment: unable to verbalize  . Drug use: Not Currently    Comment: unable to verbalize  . Sexual activity: Not Currently    Comment: unable to verbalize  Lifestyle  . Physical activity:    Days per week: Not on file    Minutes per session: Not on file  . Stress: Not on file  Relationships  . Social connections:    Talks on phone: Not on file    Gets together: Not on file    Attends religious service: Not on file    Active member of club or organization: Not on file    Attends meetings of clubs or organizations: Not on file    Relationship status: Not on  file  . Intimate partner violence:    Fear of current or ex partner: Not on file    Emotionally abused: Not on file    Physically abused: Not on file    Forced sexual activity: Not on file  Other Topics Concern  . Not on file  Social History Narrative  . Not on file     Family History  Problem Relation Age of Onset  . Hypertension Mother   . CAD Father      Review of Systems Cannot assess due to stroke   labs: Recent Labs    05/10/18 2129 05/11/18 1308  TROPONINI 0.03* 0.13*   Lab Results  Component Value Date   WBC 10.9 (H) 05/11/2018   HGB 14.3 05/11/2018   HCT 41.4 05/11/2018   MCV 89.4 05/11/2018   PLT 224 05/11/2018    Recent Labs  Lab 05/10/18 2129 05/11/18 0503  NA 135 137  K 4.6 4.1  CL 104 106  CO2 22 22  BUN 14 13  CREATININE 0.83 0.63  CALCIUM 8.4* 8.2*  PROT 5.9*  --   BILITOT 1.3*  --   ALKPHOS 47  --   ALT 19  --   AST 20  --   GLUCOSE 210* 117*   Lab Results  Component Value Date   CHOL 150 05/07/2018   HDL 49 05/07/2018   LDLCALC 87 05/07/2018   TRIG 71 05/07/2018   No results found for: DDIMER  Radiology/Studies:  Ct Head Wo Contrast  Result Date: 05/10/2018 CLINICAL DATA:  84 year old female with distal left ICA thrombosis, left lentiform, internal capsule and medial left temporal lobe infarct. EXAM: CT HEAD WITHOUT CONTRAST TECHNIQUE: Contiguous axial images were obtained from the base of the skull through the vertex without intravenous contrast. COMPARISON:  CTA neck and brain MRI 05/07/2018.  Head CT 05/06/2018. FINDINGS: Brain: Hypodensity from the mesial left temporal lobe through the left lentiform nuclei tracking toward the left corona radiata corresponds well to the restricted diffusion on 05/07/2018. No associated hemorrhage or mass effect. Patchy white matter hypodensity elsewhere appears stable. No midline shift, ventriculomegaly, mass effect, evidence of mass lesion, or intracranial hemorrhage. Vascular: Stable  noncontrast CT appearance. Skull: Negative. Sinuses/Orbits: Visualized paranasal sinuses and mastoids are stable and well pneumatized. Other: Stable orbit and scalp soft tissues. IMPRESSION: 1. Stable/expected appearance of the deep left MCA territory infarct seen by MRI on 05/07/2018. No associated hemorrhage or mass effect. 2. No new intracranial abnormality. Electronically Signed   By: Odessa Fleming M.D.   On: 05/10/2018 13:44   Ct Head Wo Contrast  Result Date: 05/06/2018 CLINICAL DATA:  84 year old female fell and presumed to be on floor for 3 days per neighbor. Right-sided weakness. Initial encounter. EXAM: CT HEAD WITHOUT CONTRAST  CT CERVICAL SPINE WITHOUT CONTRAST TECHNIQUE: Multidetector CT imaging of the head and cervical spine was performed following the standard protocol without intravenous contrast. Multiplanar CT image reconstructions of the cervical spine were also generated. COMPARISON:  12/25/2013 CT. FINDINGS: CT HEAD FINDINGS Brain: Age-indeterminate infarct versus prominent chronic microvascular changes genu of the left internal capsule. Otherwise no CT evidence of large acute infarct. No intracranial hemorrhage. Prominent chronic microvascular changes. Global atrophy. No intracranial mass lesion noted on this unenhanced exam. Vascular: No hyperdense vessel. Skull: No skull fracture. Sinuses/Orbits: Post lens replacement. No acute orbital abnormality. Mucosal thickening ethmoid sinus air cells. Other: Mastoid air cells and middle ear cavities are clear. CT CERVICAL SPINE FINDINGS Alignment: Mild rotation head and C1 upon C2. Skull base and vertebrae: No cervical spine fracture. Soft tissues and spinal canal: No abnormal prevertebral soft tissue swelling. Disc levels: Cervical spondylotic changes most notable C4-5 through C6-7. No high-grade spinal stenosis. Upper chest: No worrisome abnormality. Other: No worrisome neck mass.  Carotid bifurcation calcifications. IMPRESSION: CT HEAD 1.  Age-indeterminate infarct versus prominent chronic microvascular changes genu of the left internal capsule. Otherwise no CT evidence of large acute infarct. 2. No skull fracture or intracranial hemorrhage. 3. Prominent chronic microvascular changes. 4. Global atrophy. CT CERVICAL SPINE 1. Mild rotation head/C1 upon C2 may be related to head position. 2. No cervical spine fracture or abnormal prevertebral soft tissue swelling. 3. Spondylotic changes most notable C4-5 through C6-7. No high-grade spinal stenosis. Electronically Signed   By: Lacy Duverney M.D.   On: 05/06/2018 15:27   Ct Angio Neck W Or Wo Contrast  Result Date: 05/07/2018 CLINICAL DATA:  84 y/o  F; follow-up of stroke. EXAM: CT ANGIOGRAPHY NECK TECHNIQUE: Multidetector CT imaging of the neck was performed using the standard protocol during bolus administration of intravenous contrast. Multiplanar CT image reconstructions and MIPs were obtained to evaluate the vascular anatomy. Carotid stenosis measurements (when applicable) are obtained utilizing NASCET criteria, using the distal internal carotid diameter as the denominator. CONTRAST:  75mL OMNIPAQUE IOHEXOL 350 MG/ML SOLN COMPARISON:  05/07/2018 carotid ultrasound. 05/07/2018 MRI of the head and MRA of the head. FINDINGS: Aortic arch: Common origin of right brachiocephalic and left common carotid arteries. Imaged portion shows no evidence of aneurysm or dissection. No significant stenosis of the major arch vessel origins. Moderate calcific atherosclerosis. Right carotid system: No evidence of dissection, stenosis (50% or greater) or occlusion. Left carotid system: No evidence of dissection, stenosis (50% or greater) or occlusion in the neck. Non stenotic mild calcified plaque of the carotid bifurcation. Thrombotic occlusion of the left internal carotid artery starting from the distal cavernous segment to terminus, circle-of-Willis not within the field of view (series 8, image 71). Vertebral  arteries: Codominant. No evidence of dissection, stenosis (50% or greater) or occlusion. Skeleton: Mild-to-moderate cervical spondylosis with multilevel disc and facet degenerative changes. No high-grade bony canal stenosis. Other neck: Negative. Upper chest: Biapical pleuroparenchymal scarring with calcification. Small right pleural effusion. IMPRESSION: 1. 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 Visible segments of the ICA are stable from the prior MRA of the head given differences in technique. 2. Patent carotid and vertebral arteries of the neck. No evidence of dissection, hemodynamically significant stenosis by NASCET criteria, or occlusion in the neck. 3. Small right pleural effusion. These results will be called to the ordering clinician or representative by the Radiologist Assistant, and communication documented in the  PACS or zVision Dashboard. Electronically Signed   By: Mitzi Hansen M.D.   On: 05/07/2018 13:56   Ct Cervical Spine Wo Contrast  Result Date: 05/06/2018 CLINICAL DATA:  84 year old female fell and presumed to be on floor for 3 days per neighbor. Right-sided weakness. Initial encounter. EXAM: CT HEAD WITHOUT CONTRAST CT CERVICAL SPINE WITHOUT CONTRAST TECHNIQUE: Multidetector CT imaging of the head and cervical spine was performed following the standard protocol without intravenous contrast. Multiplanar CT image reconstructions of the cervical spine were also generated. COMPARISON:  12/25/2013 CT. FINDINGS: CT HEAD FINDINGS Brain: Age-indeterminate infarct versus prominent chronic microvascular changes genu of the left internal capsule. Otherwise no CT evidence of large acute infarct. No intracranial hemorrhage. Prominent chronic microvascular changes. Global atrophy. No intracranial mass lesion noted on this unenhanced exam. Vascular: No hyperdense vessel. Skull: No skull  fracture. Sinuses/Orbits: Post lens replacement. No acute orbital abnormality. Mucosal thickening ethmoid sinus air cells. Other: Mastoid air cells and middle ear cavities are clear. CT CERVICAL SPINE FINDINGS Alignment: Mild rotation head and C1 upon C2. Skull base and vertebrae: No cervical spine fracture. Soft tissues and spinal canal: No abnormal prevertebral soft tissue swelling. Disc levels: Cervical spondylotic changes most notable C4-5 through C6-7. No high-grade spinal stenosis. Upper chest: No worrisome abnormality. Other: No worrisome neck mass.  Carotid bifurcation calcifications. IMPRESSION: CT HEAD 1. Age-indeterminate infarct versus prominent chronic microvascular changes genu of the left internal capsule. Otherwise no CT evidence of large acute infarct. 2. No skull fracture or intracranial hemorrhage. 3. Prominent chronic microvascular changes. 4. Global atrophy. CT CERVICAL SPINE 1. Mild rotation head/C1 upon C2 may be related to head position. 2. No cervical spine fracture or abnormal prevertebral soft tissue swelling. 3. Spondylotic changes most notable C4-5 through C6-7. No high-grade spinal stenosis. Electronically Signed   By: Lacy Duverney M.D.   On: 05/06/2018 15:27   Ct Pelvis Wo Contrast  Result Date: 05/06/2018 CLINICAL DATA:  Patient status post fall at some point over the past 3 days. Pelvic pain. Initial encounter. EXAM: CT PELVIS WITHOUT CONTRAST TECHNIQUE: Multidetector CT imaging of the pelvis was performed following the standard protocol without intravenous contrast. COMPARISON:  CT abdomen and pelvis 03/24/2014. FINDINGS: Urinary Tract: The urinary bladder is completely decompressed with a Foley catheter in place. Bowel: Sigmoid diverticulosis is noted. Imaged bowel loops are otherwise unremarkable. Vascular/Lymphatic: Atherosclerotic vascular disease is seen. No lymphadenopathy. Reproductive:  No mass or other significant abnormality Other:  No fluid collection.  Musculoskeletal: The hips are located. No fracture is identified. Small bone islands in the right sacrum and left ilium are unchanged. Mild right hip joint space narrowing is identified. Mild degenerative change is also seen at the symphysis pubis. There is some facet arthropathy and a disc bulge at L5-S1. IMPRESSION: Negative for fracture.  No acute abnormality. Lower lumbar spondylosis. Mild degenerative change right hip and symphysis pubis also noted. Atherosclerosis. Diverticulosis. Electronically Signed   By: Drusilla Kanner M.D.   On: 05/06/2018 15:15   Mr Brain Wo Contrast  Result Date: 05/07/2018 CLINICAL DATA:  Ataxia EXAM: MRI HEAD WITHOUT CONTRAST MRA HEAD WITHOUT CONTRAST TECHNIQUE: Multiplanar, multiecho pulse sequences of the brain and surrounding structures were obtained without intravenous contrast. Angiographic images of the head were obtained using MRA technique without contrast. COMPARISON:  Head CT 05/06/2018 FINDINGS: MRI HEAD FINDINGS BRAIN: There is abnormal diffusion restriction of the left lentiform nucleus, posterior limb of the left internal capsule and medial left temporal lobe. The midline structures  are normal. Early confluent hyperintense T2-weighted signal of the periventricular and deep white matter, most commonly due to chronic ischemic microangiopathy. Generalized atrophy without lobar predilection. Single focus of chronic microhemorrhage in the left frontal lobe. SKULL AND UPPER CERVICAL SPINE: The visualized skull base, calvarium, upper cervical spine and extracranial soft tissues are normal. SINUSES/ORBITS: No fluid levels or advanced mucosal thickening. No mastoid or middle ear effusion. The orbits are normal. MRA HEAD FINDINGS Intracranial internal carotid arteries: There is diminished flow related enhancement of the left internal carotid artery lacerum, cavernous and supraclinoid segments. There is a central defect within the cavernous segment and at the carotid  terminus. The right ICA is normal. Anterior cerebral arteries: Normal. Middle cerebral arteries: The right MCA is normal. The left middle cerebral artery is patent proximally. However, there is limited flow related enhancement of the distal branches. Posterior communicating arteries: Present on the left. Posterior cerebral arteries: Normal. Basilar artery: Normal. Vertebral arteries: Codominant normal. Superior cerebellar arteries: Normal. Inferior cerebellar arteries: Normal. IMPRESSION: 1. 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 No hemorrhage or mass effect. 2. Diminished flow related enhancement of the left internal carotid artery at the skull base with suspected acute thrombus. The left middle cerebral artery is normal proximally, but there is minimal flow related enhancement of the distal branches. 3. Chronic ischemic microangiopathy and generalized atrophy. Electronically Signed   By: Deatra Robinson M.D.   On: 05/07/2018 01:58   Ct Abdomen Pelvis W Contrast  Result Date: 05/10/2018 CLINICAL DATA:  84 year old female with recent left MCA cerebral infarct. Abdominal pain. EXAM: CT ABDOMEN AND PELVIS WITH CONTRAST TECHNIQUE: Multidetector CT imaging of the abdomen and pelvis was performed using the standard protocol following bolus administration of intravenous contrast. CONTRAST:  75mL OMNIPAQUE IOHEXOL 300 MG/ML  SOLN COMPARISON:  Pelvis CT 05/06/2018.  CT chest and abdomen 05/06/2014 FINDINGS: Lower chest: Cardiomegaly appears mildly progressed since 2015. No pericardial effusion. Small layering right pleural effusion. Trace layering left pleural effusion. Mild compressive atelectasis at both lung bases. Hepatobiliary: Benign hepatic cysts are stable since 2015. There may be some vicarious excretion of contrast to the gallbladder. No pericholecystic inflammation. Central hepatic bile ducts appear stable since 2015. Pancreas:  Negative. Spleen: Negative. Adrenals/Urinary Tract: Normal adrenal glands. The kidneys appear stable and within normal limits. Renal enhancement and contrast excretion is symmetric. Proximal ureters are decompressed. The urinary bladder is distended despite the presence of a Foley catheter. Estimated bladder volume 564 milliliters. Small volume of gas within the bladder. No perivesical stranding. Stomach/Bowel: Mild presacral stranding and air-fluid level in the rectum but no rectal wall thickening. Diverticulosis in the proximal sigmoid colon is moderate, but there is no active inflammation identified. Redundant proximal sigmoid. Negative descending colon. Negative transverse colon. Redundant flexures. Negative right colon. The cecum is on a lax mesentery. There appears to be a normal appendix on sagittal image 39. Decompressed and negative terminal ileum. No dilated small bowel. Negative stomach and duodenum. No abdominal free air.  No abdominal free fluid. Vascular/Lymphatic: Aortoiliac calcified atherosclerosis. Ectasia of the infrarenal abdominal aorta has mildly progressed, now 25 millimeters diameter. Major arterial structures are patent. Portal venous system is patent. No lymphadenopathy. Reproductive: Negative. Other: Small volume pelvic free fluid with simple fluid density on the right (series 2, image 64). Musculoskeletal: No acute osseous abnormality identified. IMPRESSION: 1. A small volume of pelvic free fluid with presacral stranding is abnormal but nonspecific. There  is no convincing bowel inflammation or other inflammatory process in the abdomen or pelvis. 2. Distended urinary bladder despite the presence of a Foley catheter. Estimated bladder volume 564 mL. 3. Small layering pleural effusions with atelectasis greater on the right. 4. Sigmoid diverticulosis without active inflammation. 5. Aortic Atherosclerosis (ICD10-I70.0) and ectatic abdominal aorta at risk for aneurysm development. Recommend  followup by Ultrasound in 5 years. This recommendation follows ACR consensus guidelines: White Paper of the ACR Incidental Findings Committee II on Vascular Findings. J Am Coll Radiol 2013; 10:789-794. Electronically Signed   By: Odessa Fleming M.D.   On: 05/10/2018 13:53   US Carotid Bilateral (at [facility] And [facility] Only)  Result Date: 05/07/2018 CLINICAL DATA:  84 year old female with symptoms of transient ischemic attack EXAM: BILATERAL CAROTID DUPLEX ULTRASOUND TECHNIQUE: Wallace Cullens scale imaging, color Doppler and duplex ultrasound were performed of bilateral carotid and vertebral arteries in the neck. COMPARISON:  84 year old female with symptoms of transient ischemic attack FINDINGS: Criteria: Quantification of carotid stenosis is based on velocity parameters that correlate the residual internal carotid diameter with NASCET-based stenosis levels, using the diameter of the distal internal carotid lumen as the denominator for stenosis measurement. The following velocity measurements were obtained: RIGHT ICA: 74/18 cm/sec CCA: 108/16 cm/sec SYSTOLIC ICA/CCA RATIO:  0.7 ECA:  132 cm/sec LEFT ICA: 41/4 cm/sec CCA: 9 9/7 cm/sec SYSTOLIC ICA/CCA RATIO:  0.4 ECA:  137 cm/sec RIGHT CAROTID ARTERY: No significant atherosclerotic plaque or evidence of stenosis in the internal carotid artery. RIGHT VERTEBRAL ARTERY:  Patent with normal antegrade flow. LEFT CAROTID ARTERY: Mild focal heterogeneous atherosclerotic plaque in the proximal internal carotid artery. By peak systolic velocity criteria, the estimated stenosis remains less than 50%. LEFT VERTEBRAL ARTERY:  Patent with normal antegrade flow. IMPRESSION: 1. Mild (1-49%) stenosis proximal left internal carotid artery secondary to focal heterogeneous atherosclerotic plaque. 2. No significant atherosclerotic plaque or evidence of stenosis in the right internal carotid artery. 3. Vertebral arteries are patent with normal antegrade flow. Signed, Sterling Big, MD, RPVI [facility] Radiology Electronically Signed   By: Malachy Moan M.D.   On: 05/07/2018 10:48   Dg Chest Port 1 View  Result Date: 05/10/2018 CLINICAL DATA:  SVT EXAM: PORTABLE CHEST 1 VIEW COMPARISON:  05/06/2018 FINDINGS: The lungs are hyperinflated. The heart is enlarged. There is atherosclerotic calcification of the thoracic aorta. Suspect small bilateral pleural effusions. No pulmonary edema or consolidation. Skin folds overlie the chest. IMPRESSION: Stable cardiomegaly. Small bilateral effusions.  No edema. Electronically Signed   By: Norva Pavlov M.D.   On: 05/10/2018 22:11   Dg Chest Portable 1 View  Result Date: 05/06/2018 CLINICAL DATA:  Right-sided weakness, fall EXAM: PORTABLE CHEST 1 VIEW COMPARISON:  03/24/2014, CT 05/06/2014 FINDINGS: The patient's hand obscures the left lower chest. Small right-sided pleural effusion. Enlarged cardiomediastinal silhouette with vascular congestion and hazy atelectasis or edema at the bases. No pneumothorax. IMPRESSION: 1. Cardiomegaly with vascular congestion and hazy bibasilar atelectasis or mild edema. 2. Tiny right pleural effusion Electronically Signed   By: Jasmine Pang M.D.   On: 05/06/2018 14:58   Mr Maxine Glenn Head/brain ZO Cm  Result Date: 05/07/2018 CLINICAL DATA:  Ataxia EXAM: MRI HEAD WITHOUT CONTRAST MRA HEAD WITHOUT CONTRAST TECHNIQUE: Multiplanar, multiecho pulse sequences of the brain and surrounding structures were obtained without intravenous contrast. Angiographic images of the head were obtained using MRA technique without contrast. COMPARISON:  Head CT 05/06/2018 FINDINGS: MRI HEAD FINDINGS BRAIN: There is  abnormal diffusion restriction of the left lentiform nucleus, posterior limb of the left internal capsule and medial left temporal lobe. The midline structures are normal. Early confluent hyperintense T2-weighted signal of the periventricular and deep white matter, most commonly due to  chronic ischemic microangiopathy. Generalized atrophy without lobar predilection. Single focus of chronic microhemorrhage in the left frontal lobe. SKULL AND UPPER CERVICAL SPINE: The visualized skull base, calvarium, upper cervical spine and extracranial soft tissues are normal. SINUSES/ORBITS: No fluid levels or advanced mucosal thickening. No mastoid or middle ear effusion. The orbits are normal. MRA HEAD FINDINGS Intracranial internal carotid arteries: There is diminished flow related enhancement of the left internal carotid artery lacerum, cavernous and supraclinoid segments. There is a central defect within the cavernous segment and at the carotid terminus. The right ICA is normal. Anterior cerebral arteries: Normal. Middle cerebral arteries: The right MCA is normal. The left middle cerebral artery is patent proximally. However, there is limited flow related enhancement of the distal branches. Posterior communicating arteries: Present on the left. Posterior cerebral arteries: Normal. Basilar artery: Normal. Vertebral arteries: Codominant normal. Superior cerebellar arteries: Normal. Inferior cerebellar arteries: Normal. IMPRESSION: 1. 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 No hemorrhage or mass effect. 2. Diminished flow related enhancement of the left internal carotid artery at the skull base with suspected acute thrombus. The left middle cerebral artery is normal proximally, but there is minimal flow related enhancement of the distal branches. 3. Chronic ischemic microangiopathy and generalized atrophy. Electronically Signed   By: Deatra Robinson M.D.   On: 05/07/2018 01:58    EKG: Supraventricular tachycardia with rapid ventricular rate Early EKG shows atrial flutter with variable heart rate   Weights: Filed Weights   05/06/18 1414 05/06/18 1826 05/10/18 1900  Weight: 68 kg 51.8 kg 51.1 kg     Physical Exam: Blood  pressure 133/87, pulse 95, temperature 97.8 F (36.6 C), temperature source Axillary, resp. rate 19, height 5\' 5"  (1.651 m), weight 51.1 kg, SpO2 96 %. Body mass index is 18.75 kg/m. General: Well developed, well nourished, in no acute distress. Head eyes ears nose throat: Normocephalic, atraumatic, sclera non-icteric, no xanthomas, nares are without discharge. No apparent thyromegaly and/or mass  Lungs: Normal respiratory effort.  no wheezes, no rales, no rhonchi.  Heart: Irregular with normal S1 S2. no murmur gallop, no rub, PMI is normal size and placement, carotid upstroke normal without bruit, jugular venous pressure is normal Abdomen: Soft, non-tender, non-distended with normoactive bowel sounds. No hepatomegaly. No rebound/guarding. No obvious abdominal masses. Abdominal aorta is normal size without bruit Extremities: No edema. no cyanosis, no clubbing, no ulcers  Peripheral : 2+ bilateral upper extremity pulses, 2+ bilateral femoral pulses, 2+ bilateral dorsal pedal pulse Neuro: Is not alert and oriented.  Has facial asymmetry.  Positive focal deficit.  Does not moves all extremities spontaneously. Musculoskeletal: Normal muscle tone without kyphosis Psych: Does not responds to questions appropriately with a normal affect.    Assessment: 84 year old female with acute stroke essential hypertension mixed hyperlipidemia and atrial fibrillation flutter with rapid ventricular rate now improved with better heart rate control and hemodynamically stable without evidence of heart failure or myocardial infarction  Plan: 1.  Continue combination medication management for heart rate control including diltiazem and amiodarone and watch for any spontaneous conversion to normal sinus rhythm 2.  Continue high intensity cholesterol therapy for peripheral vascular disease 3.  Anticoagulation for further risk reduction  in stroke with atrial fibrillation flutter 4.  No further cardiac diagnostics necessary  at this time  Signed, Lamar Blinks M.D. [facility] Cardiology 05/11/2018, 4:21 PM

## 2018-05-11 NOTE — Progress Notes (Signed)
Patient ID: Jenny Molina, female   DOB: 05/11/1924, 84 y.o.   MRN: [id number]  [employer] PROGRESS NOTE  Jenny Molina:[id number] DOB: [birth year] DOA: 05/06/2018 PCP: Marguarite Arbour, MD  HPI/Subjective: Patient able to answer few questions today.  Mental status will bit better than yesterday.  Able to swallow as per nursing staff.  Did not eat very well.  Objective: Vitals:   05/11/18 1000 05/11/18 1100  BP: 127/76 122/76  Pulse: 95 95  Resp: (!) 29 17  Temp:    SpO2: 97% 98%    Filed Weights   05/06/18 1414 05/06/18 1826 05/10/18 1900  Weight: 68 kg 51.8 kg 51.1 kg    ROS: Review of Systems  Unable to perform ROS: Mental status change  Respiratory: Negative for shortness of breath.   Cardiovascular: Negative for chest pain.  Gastrointestinal: Negative for abdominal pain, nausea and vomiting.   Exam: Physical Exam  HENT:  Nose: No mucosal edema.  Mouth/Throat: No oropharyngeal exudate.  Eyes: Pupils are equal, round, and reactive to light. Conjunctivae and lids are normal.  Neck: Carotid bruit is not present.  Cardiovascular: Regular rhythm, S1 normal, S2 normal and normal heart sounds.  Respiratory: She has decreased breath sounds in the right lower field and the left lower field. She has no wheezes. She has no rhonchi. She has no rales.  GI: Soft. Bowel sounds are normal. There is tenderness.  Musculoskeletal:       Right ankle: She exhibits no swelling.       Left ankle: She exhibits no swelling.  Neurological: She is alert.  Right-sided paralysis  Skin: Skin is warm. No rash noted.  Psychiatric:  Answers a few questions a little bit more than yesterday.      Data Reviewed: Basic Metabolic Panel: Recent Labs  Lab 05/06/18 1419 05/08/18 0608 05/09/18 0523 05/10/18 2032 05/10/18 2129 05/11/18 0503  NA 139 142 139  --  135 137  K 3.4* 2.8* 3.6  --  4.6 4.1  CL 102 108 105  --  104 106  CO2 22 21* 27  --  22 22  GLUCOSE 126* 67* 147*  --   210* 117*  BUN 21 15 10   --  14 13  CREATININE 0.63 0.55 0.48  --  0.83 0.63  CALCIUM 9.2 7.8* 8.3*  --  8.4* 8.2*  MG  --  1.8  --  1.6*  --  2.0  PHOS  --  2.5  --   --  3.7 3.5   Liver Function Tests: Recent Labs  Lab 05/06/18 1419 05/10/18 2129  AST 61* 20  ALT 28 19  ALKPHOS 58 47  BILITOT 2.0* 1.3*  PROT 7.1 5.9*  ALBUMIN 4.1 3.1*   CBC: Recent Labs  Lab 05/06/18 1419 05/08/18 0608 05/10/18 2129 05/11/18 0557  WBC 15.2* 8.7 15.3* 10.9*  NEUTROABS 13.6* 6.9  --   --   HGB 15.5* 12.8 14.5 14.3  HCT 44.8 37.9 43.0 41.4  MCV 88.4 90.0 89.6 89.4  PLT 246 192 278 224   Cardiac Enzymes: Recent Labs  Lab 05/06/18 1419 05/07/18 0430 05/08/18 0608 05/10/18 2129 05/11/18 1308  CKTOTAL 1,678* 539* 213  --   --   TROPONINI 0.03*  --  0.04* 0.03* 0.13*    CBG: Recent Labs  Lab 05/07/18 2359 05/08/18 0602 05/08/18 0645 05/09/18 2052 05/10/18 1855  GLUCAP 71 69* 155* 155* 149*     Scheduled Meds: . aspirin  300  mg Rectal Daily   Or  . aspirin  325 mg Oral Daily  . atorvastatin  40 mg Oral q1800  . diltiazem  60 mg Oral Q6H  . enoxaparin (LOVENOX) injection  40 mg Subcutaneous Q24H  . levothyroxine  50 mcg Oral QAC breakfast   Continuous Infusions: . amiodarone 30 mg/hr (05/11/18 0940)  . dextrose 5 % and 0.9% NaCl 30 mL/hr at 05/11/18 0400  . diltiazem (CARDIZEM) infusion Stopped (05/10/18 2118)    Assessment/Plan:  1. Acute encephalopathy.  This seems a little bit better than yesterday after stopping Vimpat. 2. SVT, atrial fibrillation.  Needed to be placed on amiodarone drip.  Will be going out of the ICU to 2 8. 3. Acute CVA  with thrombus in left carotid artery.  Right-sided paralysis.  On aspirin. 4. Abdominal pain.  Resolved after repositioning Foley yesterday 5. Hypertension on amlodipine 6. Hyperlipidemia on atorvastatin 7. Hypothyroidism unspecified on levothyroxine  Code Status:     Code Status Orders  (From admission, onward)          Start     Ordered   05/06/18 2126  Do not attempt resuscitation (DNR)  Continuous    Question Answer Comment  In the event of cardiac or respiratory ARREST Do not call a "code blue"   In the event of cardiac or respiratory ARREST Do not perform Intubation, CPR, defibrillation or ACLS   In the event of cardiac or respiratory ARREST Use medication by any route, position, wound care, and other measures to relive pain and suffering. May use oxygen, suction and manual treatment of airway obstruction as needed for comfort.      05/06/18 2125        Code Status History    Date Active Date Inactive Code Status Order ID Comments User Context   05/06/2018 1626 05/06/2018 2125 Full Code [id number]  Milagros Loll, MD ED    Advance Directive Documentation     Most Recent Value  Type of Advance Directive  Healthcare Power of Attorney, Living will  Pre-existing out of facility DNR order (yellow form or pink MOST form)  -  "MOST" Form in Place?  -     Family Communication: Family yesterday. Disposition Plan: To be determined.  Consultants:  Neurology  Time spent: 26 minutes  Norm Wray [employer]  [employer]           Patient ID: Jenny Molina, female   DOB: 1924/05/23, 84 y.o.   MRN: [id number]

## 2018-05-11 NOTE — Progress Notes (Signed)
Good day. Responds quietly. Noted to neglect her right side. Will follow your finger to right side when asked. States she can see out of both eyes when glasses placed on. Swallows well with nectar thick liquids and plenty of time. Very poor appetite. Takes medications crushed in applesauce or ice cream. Withdraws left side to pain and right foot but not the right hand. Very drowsy today. Napped often today. Knows name and ask what day it was. Remains in NSR. Amiodarone at 30mg /hour.

## 2018-05-12 ENCOUNTER — Inpatient Hospital Stay: Payer: Medicare Other

## 2018-05-12 DIAGNOSIS — I639 Cerebral infarction, unspecified: Secondary | ICD-10-CM

## 2018-05-12 LAB — BASIC METABOLIC PANEL
Anion gap: 7 (ref 5–15)
BUN: 19 mg/dL (ref 8–23)
CALCIUM: 8.7 mg/dL — AB (ref 8.9–10.3)
CO2: 26 mmol/L (ref 22–32)
CREATININE: 0.64 mg/dL (ref 0.44–1.00)
Chloride: 105 mmol/L (ref 98–111)
GFR calc Af Amer: >60 mL/min (ref >60)
GLUCOSE: 111 mg/dL — AB (ref 70–99)
Potassium: 3.6 mmol/L (ref 3.5–5.1)
Sodium: 138 mmol/L (ref 135–145)

## 2018-05-12 LAB — CBC WITH DIFFERENTIAL/PLATELET
Abs Immature Granulocytes: 0.07 10*3/uL (ref 0.00–0.07)
BASOS PCT: 0 %
Basophils Absolute: 0 10*3/uL (ref 0.0–0.1)
EOS ABS: 0.2 10*3/uL (ref 0.0–0.5)
EOS PCT: 1 %
HEMATOCRIT: 43.9 % (ref 36.0–46.0)
Hemoglobin: 14.8 g/dL (ref 12.0–15.0)
IMMATURE GRANULOCYTES: 1 %
LYMPHS ABS: 1.3 10*3/uL (ref 0.7–4.0)
Lymphocytes Relative: 11 %
MCH: 30.2 pg (ref 26.0–34.0)
MCHC: 33.7 g/dL (ref 30.0–36.0)
MCV: 89.6 fL (ref 80.0–100.0)
Monocytes Absolute: 0.8 10*3/uL (ref 0.1–1.0)
Monocytes Relative: 7 %
NEUTROS PCT: 80 %
Neutro Abs: 9.5 10*3/uL — ABNORMAL HIGH (ref 1.7–7.7)
PLATELETS: 279 10*3/uL (ref 150–400)
RBC: 4.9 MIL/uL (ref 3.87–5.11)
RDW: 12.7 % (ref 11.5–15.5)
WBC: 12 10*3/uL — ABNORMAL HIGH (ref 4.0–10.5)
nRBC: 0 % (ref 0.0–0.2)

## 2018-05-12 LAB — T4, FREE: Free T4: 1.13 ng/dL (ref 0.82–1.77)

## 2018-05-12 LAB — MAGNESIUM: Magnesium: 1.9 mg/dL (ref 1.7–2.4)

## 2018-05-12 LAB — PROCALCITONIN

## 2018-05-12 LAB — PHOSPHORUS: Phosphorus: 4.1 mg/dL (ref 2.5–4.6)

## 2018-05-12 MED ORDER — AMIODARONE HCL 200 MG PO TABS
200.0000 mg | ORAL_TABLET | Freq: Two times a day (BID) | ORAL | Status: DC
  Administered 2018-05-12 – 2018-05-13 (×3): 200 mg via ORAL
  Filled 2018-05-12 (×3): qty 1

## 2018-05-12 MED ORDER — BISACODYL 10 MG RE SUPP
10.0000 mg | Freq: Every day | RECTAL | Status: DC | PRN
  Administered 2018-05-12: 10 mg via RECTAL
  Filled 2018-05-12 (×2): qty 1

## 2018-05-12 MED ORDER — POLYETHYLENE GLYCOL 3350 17 G PO PACK
17.0000 g | PACK | Freq: Every day | ORAL | Status: DC
  Administered 2018-05-12: 17 g via ORAL
  Filled 2018-05-12 (×2): qty 1

## 2018-05-12 NOTE — Progress Notes (Signed)
1515 Report called to [facility] RN on 1 C.

## 2018-05-12 NOTE — Progress Notes (Signed)
Patient ID: Jenny Molina, female   DOB: Jan 15, 1924, 84 y.o.   MRN: [id number]  [employer] PROGRESS NOTE  Jenny Molina:[id number] DOB: Aug 29, 1923 DOA: 05/06/2018 PCP: Marguarite Arbour, MD  HPI/Subjective: Patient answers a few questions but does not elaborate much.  States she does not feel well but cannot tell me why.  Unable to move her right side.  Objective: Vitals:   05/12/18 1214 05/12/18 1552  BP: 123/78 117/76  Pulse: 94 100  Resp: 18 16  Temp: 97.8 F (36.6 C) 97.7 F (36.5 C)  SpO2: 96% 98%    Filed Weights   05/10/18 1900 05/12/18 0900 05/12/18 1552  Weight: 51.1 kg 52 kg 52.8 kg    ROS: Review of Systems  Unable to perform ROS: Mental status change  Respiratory: Negative for shortness of breath.   Cardiovascular: Negative for chest pain.  Gastrointestinal: Negative for abdominal pain.   Exam: Physical Exam  HENT:  Nose: No mucosal edema.  Mouth/Throat: No oropharyngeal exudate.  Eyes: Pupils are equal, round, and reactive to light. Conjunctivae and lids are normal.  Neck: Carotid bruit is not present.  Cardiovascular: Regular rhythm, S1 normal, S2 normal and normal heart sounds.  Respiratory: She has decreased breath sounds in the right lower field and the left lower field. She has no wheezes. She has no rhonchi. She has no rales.  GI: Soft. Bowel sounds are normal. There is tenderness.  Musculoskeletal:       Right ankle: She exhibits no swelling.       Left ankle: She exhibits no swelling.  Neurological: She is alert.  Right-sided paralysis  Skin: Skin is warm. No rash noted.  Psychiatric:  Answers a few questions a little bit more than yesterday.      Data Reviewed: Basic Metabolic Panel: Recent Labs  Lab 05/08/18 0608 05/09/18 0523 05/10/18 2032 05/10/18 2129 05/11/18 0503 05/12/18 0411  NA 142 139  --  135 137 138  K 2.8* 3.6  --  4.6 4.1 3.6  CL 108 105  --  104 106 105  CO2 21* 27  --  22 22 26   GLUCOSE 67* 147*  --   210* 117* 111*  BUN 15 10  --  14 13 19   CREATININE 0.55 0.48  --  0.83 0.63 0.64  CALCIUM 7.8* 8.3*  --  8.4* 8.2* 8.7*  MG 1.8  --  1.6*  --  2.0 1.9  PHOS 2.5  --   --  3.7 3.5 4.1   Liver Function Tests: Recent Labs  Lab 05/06/18 1419 05/10/18 2129  AST 61* 20  ALT 28 19  ALKPHOS 58 47  BILITOT 2.0* 1.3*  PROT 7.1 5.9*  ALBUMIN 4.1 3.1*   CBC: Recent Labs  Lab 05/06/18 1419 05/08/18 0608 05/10/18 2129 05/11/18 0557 05/12/18 0411  WBC 15.2* 8.7 15.3* 10.9* 12.0*  NEUTROABS 13.6* 6.9  --   --  9.5*  HGB 15.5* 12.8 14.5 14.3 14.8  HCT 44.8 37.9 43.0 41.4 43.9  MCV 88.4 90.0 89.6 89.4 89.6  PLT 246 192 278 224 279   Cardiac Enzymes: Recent Labs  Lab 05/06/18 1419 05/07/18 0430 05/08/18 0608 05/10/18 2129 05/11/18 1308 05/11/18 1921  CKTOTAL 1,678* 539* 213  --   --   --   TROPONINI 0.03*  --  0.04* 0.03* 0.13* 0.13*    CBG: Recent Labs  Lab 05/07/18 2359 05/08/18 0602 05/08/18 0645 05/09/18 2052 05/10/18 1855  GLUCAP 71 69* 155*  155* 149*     Scheduled Meds: . amiodarone  200 mg Oral BID  . aspirin  300 mg Rectal Daily   Or  . aspirin  325 mg Oral Daily  . atorvastatin  40 mg Oral q1800  . diltiazem  60 mg Oral Q6H  . enoxaparin (LOVENOX) injection  40 mg Subcutaneous Q24H  . levothyroxine  50 mcg Oral QAC breakfast  . polyethylene glycol  17 g Oral Daily   Continuous Infusions: . dextrose 5 % and 0.9% NaCl 30 mL/hr at 05/11/18 2300  . doxycycline (VIBRAMYCIN) IV 125 mL/hr at 05/12/18 0600    Assessment/Plan:  1. Acute CVA  with thrombus in left carotid artery.  Right-sided paralysis.  On aspirin and atorvastatin.  Case discussed with neurology today.  Her overall prognosis is going to be poor.  We will get palliative care consultation.  Family was agreeable over the weekend for hospice if she declined. 2. SVT and atrial fibrillation on amiodarone and diltiazem at this time. 3. Hypertension on Cardizem 4. Hyperlipidemia on  atorvastatin 5. Hypothyroidism unspecified on levothyroxine 6. Discontinue Foley  Code Status:     Code Status Orders  (From admission, onward)         Start     Ordered   05/06/18 2126  Do not attempt resuscitation (DNR)  Continuous    Question Answer Comment  In the event of cardiac or respiratory ARREST Do not call a "code blue"   In the event of cardiac or respiratory ARREST Do not perform Intubation, CPR, defibrillation or ACLS   In the event of cardiac or respiratory ARREST Use medication by any route, position, wound care, and other measures to relive pain and suffering. May use oxygen, suction and manual treatment of airway obstruction as needed for comfort.      05/06/18 2125        Code Status History    Date Active Date Inactive Code Status Order ID Comments User Context   05/06/2018 1626 05/06/2018 2125 Full Code [id number]  Milagros Loll, MD ED    Advance Directive Documentation     Most Recent Value  Type of Advance Directive  Healthcare Power of Attorney, Living will  Pre-existing out of facility DNR order (yellow form or pink MOST form)  -  "MOST" Form in Place?  -     Disposition Plan: To be determined.  Consultants:  Neurology  Cardiology  Critical care specialist  Time spent: 25 minutes  Toyia Jelinek [employer]

## 2018-05-12 NOTE — Progress Notes (Addendum)
Subjective: Patient with continued right sided weakness.  Per son still has difficulty with communication.  Although able to eat often refuses to eat.  Has previously stated she does not want a PEG.    Objective: Current vital signs: BP 117/76 (BP Location: Right Arm)   Pulse 100   Temp 97.7 F (36.5 C) (Oral)   Resp 16   Ht 5\' 5"  (1.651 m)   Wt 52.8 kg   SpO2 98%   BMI 19.39 kg/m  Vital signs in last 24 hours: Temp:  [97.6 F (36.4 C)-98.9 F (37.2 C)] 97.7 F (36.5 C) (10/28 1552) Pulse Rate:  [75-111] 100 (10/28 1552) Resp:  [15-26] 16 (10/28 1552) BP: (102-135)/(60-91) 117/76 (10/28 1552) SpO2:  [94 %-98 %] 98 % (10/28 1552) Weight:  [52 kg-52.8 kg] 52.8 kg (10/28 1552)  Intake/Output from previous day: 10/27 0701 - 10/28 0700 In: 1571.7 [I.V.:1037.7; IV Piggyback:534] Out: 375 [Urine:375] Intake/Output this shift: Total I/O In: 50 [I.V.:50] Out: 310 [Urine:310] Nutritional status:  Diet Order            DIET - DYS 1 Room service appropriate? Yes with Assist; Fluid consistency: Nectar Thick  Diet effective now              Neurologic Exam: Mental Status: Easily awakened.  Both expressive receptive aphasia being unable to follow even simple commands.   Cranial Nerves: II: Discs flat bilaterally; Searches the room visually with right preference III,IV, VI: ptosis not present, extra-ocular motions intact bilaterally V,VII: right facial Motor: 0/5 in the right upper and lower extremities   Lab Results: Basic Metabolic Panel: Recent Labs  Lab 05/08/18 0608 05/09/18 0523 05/10/18 2032 05/10/18 2129 05/11/18 0503 05/12/18 0411  NA 142 139  --  135 137 138  K 2.8* 3.6  --  4.6 4.1 3.6  CL 108 105  --  104 106 105  CO2 21* 27  --  22 22 26   GLUCOSE 67* 147*  --  210* 117* 111*  BUN 15 10  --  14 13 19   CREATININE 0.55 0.48  --  0.83 0.63 0.64  CALCIUM 7.8* 8.3*  --  8.4* 8.2* 8.7*  MG 1.8  --  1.6*  --  2.0 1.9  PHOS 2.5  --   --  3.7 3.5 4.1     Liver Function Tests: Recent Labs  Lab 05/06/18 1419 05/10/18 2129  AST 61* 20  ALT 28 19  ALKPHOS 58 47  BILITOT 2.0* 1.3*  PROT 7.1 5.9*  ALBUMIN 4.1 3.1*   No results for input(s): LIPASE, AMYLASE in the last 168 hours. No results for input(s): AMMONIA in the last 168 hours.  CBC: Recent Labs  Lab 05/06/18 1419 05/08/18 0608 05/10/18 2129 05/11/18 0557 05/12/18 0411  WBC 15.2* 8.7 15.3* 10.9* 12.0*  NEUTROABS 13.6* 6.9  --   --  9.5*  HGB 15.5* 12.8 14.5 14.3 14.8  HCT 44.8 37.9 43.0 41.4 43.9  MCV 88.4 90.0 89.6 89.4 89.6  PLT 246 192 278 224 279    Cardiac Enzymes: Recent Labs  Lab 05/06/18 1419 05/07/18 0430 05/08/18 0608 05/10/18 2129 05/11/18 1308 05/11/18 1921  CKTOTAL 1,678* 539* 213  --   --   --   TROPONINI 0.03*  --  0.04* 0.03* 0.13* 0.13*    Lipid Panel: Recent Labs  Lab 05/07/18 0430  CHOL 150  TRIG 71  HDL 49  CHOLHDL 3.1  VLDL 14  LDLCALC 87  CBG: Recent Labs  Lab 05/07/18 2359 05/08/18 0602 05/08/18 0645 05/09/18 2052 05/10/18 1855  GLUCAP 71 69* 155* 155* 149*    Microbiology: Results for orders placed or performed during the hospital encounter of 05/06/18  MRSA PCR Screening     Status: None   Collection Time: 05/10/18 11:30 PM  Result Value Ref Range Status   MRSA by PCR NEGATIVE NEGATIVE Final    Comment:        The GeneXpert MRSA Assay (FDA approved for NASAL specimens only), is one component of a comprehensive MRSA colonization surveillance program. It is not intended to diagnose MRSA infection nor to guide or monitor treatment for MRSA infections. Performed at [facility] [address] [place], [place] [address]     Coagulation Studies: Recent Labs    05/11/18 0503  LABPROT 15.5*  INR 1.24    Imaging: Dg Chest Port 1 View  Result Date: 05/12/2018 CLINICAL DATA:  Pneumonia EXAM: PORTABLE CHEST 1 VIEW COMPARISON:  05/10/2018; 05/06/2018; chest CT-05/06/2014 FINDINGS:  Grossly unchanged enlarged cardiac silhouette and mediastinal contours with atherosclerotic plaque within the thoracic aorta. The lungs are hyperexpanded with flattening of the diaphragms and blunting of the bilateral costophrenic angles. No focal airspace opacities. Trace pleural effusions are not excluded. No evidence of edema. No pneumothorax. No acute osseus abnormalities. IMPRESSION: Similar findings of cardiomegaly, lung hyperexpansion and suspected trace bilateral effusions without superimposed acute cardiopulmonary disease. Specifically, no evidence of pulmonary edema or focal airspace opacities to suggest pneumonia. Electronically Signed   By: Simonne Come M.D.   On: 05/12/2018 07:35   Dg Chest Port 1 View  Result Date: 05/10/2018 CLINICAL DATA:  SVT EXAM: PORTABLE CHEST 1 VIEW COMPARISON:  05/06/2018 FINDINGS: The lungs are hyperinflated. The heart is enlarged. There is atherosclerotic calcification of the thoracic aorta. Suspect small bilateral pleural effusions. No pulmonary edema or consolidation. Skin folds overlie the chest. IMPRESSION: Stable cardiomegaly. Small bilateral effusions.  No edema. Electronically Signed   By: Norva Pavlov M.D.   On: 05/10/2018 22:11    Medications:  I have reviewed the patient's current medications. Scheduled: . amiodarone  200 mg Oral BID  . aspirin  300 mg Rectal Daily   Or  . aspirin  325 mg Oral Daily  . atorvastatin  40 mg Oral q1800  . diltiazem  60 mg Oral Q6H  . enoxaparin (LOVENOX) injection  40 mg Subcutaneous Q24H  . levothyroxine  50 mcg Oral QAC breakfast  . polyethylene glycol  17 g Oral Daily    Assessment/Plan: Patient with continued global aphasia and right hemiplegia.  Has been refusing to eat.  Conversation had with son today about possibility of hospice.  He would like to get more information.  Patient's daughter is healthcare POA.  Recommendations: 1. Palliative consult.   2. Patient with development of afib.  Would start  Eliquis in 2-3 days   LOS: 6 days   Thana Farr, MD Neurology [phone] 05/12/2018  4:22 PM

## 2018-05-12 NOTE — Progress Notes (Signed)
   Name: Jenny Molina MRN: [id number] DOB: 14-Mar-1924     CONSULTATION DATE: 05/06/2018  Objective & objectives: Off amiodarone drip tolerating room air and dysphagia type I diet  PAST MEDICAL HISTORY :   has a past medical history of HTN (hypertension) and Hypothyroidism.  has no past surgical history on file. Prior to Admission medications   Medication Sig Start Date End Date Taking? Authorizing Provider  ALPRAZolam Prudy Feeler) 0.5 MG tablet Take 1 tablet by mouth every 8 (eight) hours. 04/01/18  Yes [provider]  amLODipine (NORVASC) 5 MG tablet Take 1 tablet by mouth daily. 01/06/18  Yes [provider]  furosemide (LASIX) 20 MG tablet Take 1 tablet by mouth daily as needed.  04/08/18 04/08/19 Yes [provider]  levothyroxine (SYNTHROID, LEVOTHROID) 50 MCG tablet Take 1 tablet by mouth daily before breakfast. 05/10/17  Yes [provider]  metoprolol (TOPROL-XL) 200 MG 24 hr tablet Take 1 tablet by mouth daily. 05/10/17  Yes [provider]   Allergies  Allergen Reactions  . Benicar [Olmesartan]   . Ciprofloxacin   . Hydralazine     Feels funny  . Hydrochlorothiazide   . Penicillins   . Sulfa Antibiotics     Sulfa drugs    FAMILY HISTORY:  family history includes CAD in her father; Hypertension in her mother. SOCIAL HISTORY:  reports that she has never smoked. She has never used smokeless tobacco. She reports that she drank alcohol. She reports that she has current or past drug history.  REVIEW OF SYSTEMS:   Unable to obtain due to critical illness   VITAL SIGNS: Temp:  [97.6 F (36.4 C)-98.9 F (37.2 C)] 98.9 F (37.2 C) (10/28 0800) Pulse Rate:  [75-97] 97 (10/28 1000) Resp:  [15-28] 17 (10/28 1000) BP: (102-135)/(60-91) 128/77 (10/28 0800) SpO2:  [95 %-98 %] 96 % (10/28 1000) Weight:  [52 kg] 52 kg (10/28 0900)  Physical Examination:  Awake, right facial droop, right-sided flaccid paralysis, slow to respond and  follows simple commands as per nursing.  Mumbled speech and a detailed neuro exam as per neurology Room air, no distress, bilateral equal air entry and no rales S1 and S2 are audible with no murmur Benign abdominal exam with feeble peristalsis Wasted extremities and no edema   ASSESSMENT / PLAN: Ischemic CVA with left infarct and left lentiform nucleus posterior limb of internal capsule and medial left temporal lobe.  Acute thrombus internal carotid artery on MRA.  Tolerating dysphagia 1 diet -Antiplatelet for secondary stroke prevention -Supportive care  Atrial flutter with RVR with no evidence of myocardial infarctions or acute coronary syndrome.  Recent echo was normal LV systolic function -Rate control amiodarone and diltiazem -Statin for dyslipidemia -Follow with cardiology  Atelectasis with possible aspiration.  improved Left lower airspace disease -Empiric Doxycycline as infective etiology cannot be ruled out -Monitor CXR + CBC + FiO2  Hypothyroidism -Optimize levothyroxine and monitor free T4  DNR  Supportive care  Critical care time 35 minutes

## 2018-05-12 NOTE — Progress Notes (Signed)
OT Cancellation Note  Patient Details Name: Jenny Molina MRN: [id number] DOB: [birth year]   Cancelled Treatment:    Reason Eval/Treat Not Completed: Medical issues which prohibited therapy. Patient noted with transfer to CCU on 05/10/18 due to uncontrolled HR. Per policy, will require new orders to resume OT services.  Please re-consult as medically appropriate.  Richrd Prime, MPH, MS, OTR/L ascom [phone] 05/12/18, 7:54 AM

## 2018-05-12 NOTE — Progress Notes (Signed)
[facility] Cardiology [facility] Encounter Note  Patient: Jenny Molina / Admit Date: 05/06/2018 / Date of Encounter: 05/12/2018, 8:25 AM   Subjective: Patient's heart rate is much more controlled.  Less than 100 bpm and stable.  No evidence of hemodynamic compromise.  No evidence of myocardial infarction although there is demand ischemia with mild elevation of troponin.  No evidence of congestive heart failure  Review of Systems: Not assessed due to stroke   objective: Telemetry: Atrial tachycardia Physical Exam: Blood pressure 104/77, pulse 95, temperature 97.6 F (36.4 C), temperature source Axillary, resp. rate 16, height 5\' 5"  (1.651 m), weight 51.1 kg, SpO2 97 %. Body mass index is 18.75 kg/m. General: Well developed, well nourished, in no acute distress. Head: Normocephalic, atraumatic, sclera non-icteric, no xanthomas, nares are without discharge. Neck: No apparent masses Lungs: Normal respirations with no wheezes, no rhonchi, no rales , no crackles   Heart: Regular rate and rhythm, normal S1 S2, no murmur, no rub, no gallop, PMI is normal size and placement, carotid upstroke normal without bruit, jugular venous pressure normal Abdomen: Soft, non-tender, non-distended with normoactive bowel sounds. No hepatosplenomegaly. Abdominal aorta is normal size without bruit Extremities: No edema, no clubbing, no cyanosis, no ulcers,  Peripheral: 2+ radial, 2+ femoral, 2+ dorsal pedal pulses Neuro: Not alert and oriented.  Does not moves all extremities spontaneously. Psych: Does not responds to questions appropriately with a normal affect.   Intake/Output Summary (Last 24 hours) at 05/12/2018 0825 Last data filed at 05/12/2018 0600 Gross per 24 hour  Intake 1571.72 ml  Output 375 ml  Net 1196.72 ml    Inpatient Medications:  . aspirin  300 mg Rectal Daily   Or  . aspirin  325 mg Oral Daily  . atorvastatin  40 mg Oral q1800  . diltiazem  60 mg Oral Q6H  . enoxaparin  (LOVENOX) injection  40 mg Subcutaneous Q24H  . levothyroxine  50 mcg Oral QAC breakfast   Infusions:  . amiodarone 30 mg/hr (05/12/18 0600)  . dextrose 5 % and 0.9% NaCl 30 mL/hr at 05/11/18 2300  . doxycycline (VIBRAMYCIN) IV 125 mL/hr at 05/12/18 0600    Labs: Recent Labs    05/11/18 0503 05/12/18 0411  NA 137 138  K 4.1 3.6  CL 106 105  CO2 22 26  GLUCOSE 117* 111*  BUN 13 19  CREATININE 0.63 0.64  CALCIUM 8.2* 8.7*  MG 2.0 1.9  PHOS 3.5 4.1   Recent Labs    05/10/18 2129  AST 20  ALT 19  ALKPHOS 47  BILITOT 1.3*  PROT 5.9*  ALBUMIN 3.1*   Recent Labs    05/11/18 0557 05/12/18 0411  WBC 10.9* 12.0*  NEUTROABS  --  9.5*  HGB 14.3 14.8  HCT 41.4 43.9  MCV 89.4 89.6  PLT 224 279   Recent Labs    05/10/18 2129 05/11/18 1308 05/11/18 1921  TROPONINI 0.03* 0.13* 0.13*   Invalid input(s): POCBNP No results for input(s): HGBA1C in the last 72 hours.   Weights: Filed Weights   05/06/18 1414 05/06/18 1826 05/10/18 1900  Weight: 68 kg 51.8 kg 51.1 kg     Radiology/Studies:  Ct Head Wo Contrast  Result Date: 05/10/2018 CLINICAL DATA:  84 year old female with distal left ICA thrombosis, left lentiform, internal capsule and medial left temporal lobe infarct. EXAM: CT HEAD WITHOUT CONTRAST TECHNIQUE: Contiguous axial images were obtained from the base of the skull through the vertex without intravenous contrast. COMPARISON:  CTA neck and brain MRI 05/07/2018.  Head CT 05/06/2018. FINDINGS: Brain: Hypodensity from the mesial left temporal lobe through the left lentiform nuclei tracking toward the left corona radiata corresponds well to the restricted diffusion on 05/07/2018. No associated hemorrhage or mass effect. Patchy white matter hypodensity elsewhere appears stable. No midline shift, ventriculomegaly, mass effect, evidence of mass lesion, or intracranial hemorrhage. Vascular: Stable noncontrast CT appearance. Skull: Negative. Sinuses/Orbits: Visualized  paranasal sinuses and mastoids are stable and well pneumatized. Other: Stable orbit and scalp soft tissues. IMPRESSION: 1. Stable/expected appearance of the deep left MCA territory infarct seen by MRI on 05/07/2018. No associated hemorrhage or mass effect. 2. No new intracranial abnormality. Electronically Signed   By: Odessa Fleming M.D.   On: 05/10/2018 13:44   Ct Head Wo Contrast  Result Date: 05/06/2018 CLINICAL DATA:  84 year old female fell and presumed to be on floor for 3 days per neighbor. Right-sided weakness. Initial encounter. EXAM: CT HEAD WITHOUT CONTRAST CT CERVICAL SPINE WITHOUT CONTRAST TECHNIQUE: Multidetector CT imaging of the head and cervical spine was performed following the standard protocol without intravenous contrast. Multiplanar CT image reconstructions of the cervical spine were also generated. COMPARISON:  12/25/2013 CT. FINDINGS: CT HEAD FINDINGS Brain: Age-indeterminate infarct versus prominent chronic microvascular changes genu of the left internal capsule. Otherwise no CT evidence of large acute infarct. No intracranial hemorrhage. Prominent chronic microvascular changes. Global atrophy. No intracranial mass lesion noted on this unenhanced exam. Vascular: No hyperdense vessel. Skull: No skull fracture. Sinuses/Orbits: Post lens replacement. No acute orbital abnormality. Mucosal thickening ethmoid sinus air cells. Other: Mastoid air cells and middle ear cavities are clear. CT CERVICAL SPINE FINDINGS Alignment: Mild rotation head and C1 upon C2. Skull base and vertebrae: No cervical spine fracture. Soft tissues and spinal canal: No abnormal prevertebral soft tissue swelling. Disc levels: Cervical spondylotic changes most notable C4-5 through C6-7. No high-grade spinal stenosis. Upper chest: No worrisome abnormality. Other: No worrisome neck mass.  Carotid bifurcation calcifications. IMPRESSION: CT HEAD 1. Age-indeterminate infarct versus prominent chronic microvascular changes genu of  the left internal capsule. Otherwise no CT evidence of large acute infarct. 2. No skull fracture or intracranial hemorrhage. 3. Prominent chronic microvascular changes. 4. Global atrophy. CT CERVICAL SPINE 1. Mild rotation head/C1 upon C2 may be related to head position. 2. No cervical spine fracture or abnormal prevertebral soft tissue swelling. 3. Spondylotic changes most notable C4-5 through C6-7. No high-grade spinal stenosis. Electronically Signed   By: Lacy Duverney M.D.   On: 05/06/2018 15:27   Ct Angio Neck W Or Wo Contrast  Result Date: 05/07/2018 CLINICAL DATA:  84 y/o  F; follow-up of stroke. EXAM: CT ANGIOGRAPHY NECK TECHNIQUE: Multidetector CT imaging of the neck was performed using the standard protocol during bolus administration of intravenous contrast. Multiplanar CT image reconstructions and MIPs were obtained to evaluate the vascular anatomy. Carotid stenosis measurements (when applicable) are obtained utilizing NASCET criteria, using the distal internal carotid diameter as the denominator. CONTRAST:  75mL OMNIPAQUE IOHEXOL 350 MG/ML SOLN COMPARISON:  05/07/2018 carotid ultrasound. 05/07/2018 MRI of the head and MRA of the head. FINDINGS: Aortic arch: Common origin of right brachiocephalic and left common carotid arteries. Imaged portion shows no evidence of aneurysm or dissection. No significant stenosis of the major arch vessel origins. Moderate calcific atherosclerosis. Right carotid system: No evidence of dissection, stenosis (50% or greater) or occlusion. Left carotid system: No evidence of dissection, stenosis (50% or greater) or occlusion in the neck. Non stenotic  mild calcified plaque of the carotid bifurcation. Thrombotic occlusion of the left internal carotid artery starting from the distal cavernous segment to terminus, circle-of-Willis not within the field of view (series 8, image 71). Vertebral arteries: Codominant. No evidence of dissection, stenosis (50% or greater) or  occlusion. Skeleton: Mild-to-moderate cervical spondylosis with multilevel disc and facet degenerative changes. No high-grade bony canal stenosis. Other neck: Negative. Upper chest: Biapical pleuroparenchymal scarring with calcification. Small right pleural effusion. IMPRESSION: 1. 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 Visible segments of the ICA are stable from the prior MRA of the head given differences in technique. 2. Patent carotid and vertebral arteries of the neck. No evidence of dissection, hemodynamically significant stenosis by NASCET criteria, or occlusion in the neck. 3. Small right pleural effusion. These results will be called to the ordering clinician or representative by the Radiologist Assistant, and communication documented in the PACS or zVision Dashboard. Electronically Signed   By: Mitzi Hansen M.D.   On: 05/07/2018 13:56   Ct Cervical Spine Wo Contrast  Result Date: 05/06/2018 CLINICAL DATA:  84 year old female fell and presumed to be on floor for 3 days per neighbor. Right-sided weakness. Initial encounter. EXAM: CT HEAD WITHOUT CONTRAST CT CERVICAL SPINE WITHOUT CONTRAST TECHNIQUE: Multidetector CT imaging of the head and cervical spine was performed following the standard protocol without intravenous contrast. Multiplanar CT image reconstructions of the cervical spine were also generated. COMPARISON:  12/25/2013 CT. FINDINGS: CT HEAD FINDINGS Brain: Age-indeterminate infarct versus prominent chronic microvascular changes genu of the left internal capsule. Otherwise no CT evidence of large acute infarct. No intracranial hemorrhage. Prominent chronic microvascular changes. Global atrophy. No intracranial mass lesion noted on this unenhanced exam. Vascular: No hyperdense vessel. Skull: No skull fracture. Sinuses/Orbits: Post lens replacement. No acute orbital abnormality.  Mucosal thickening ethmoid sinus air cells. Other: Mastoid air cells and middle ear cavities are clear. CT CERVICAL SPINE FINDINGS Alignment: Mild rotation head and C1 upon C2. Skull base and vertebrae: No cervical spine fracture. Soft tissues and spinal canal: No abnormal prevertebral soft tissue swelling. Disc levels: Cervical spondylotic changes most notable C4-5 through C6-7. No high-grade spinal stenosis. Upper chest: No worrisome abnormality. Other: No worrisome neck mass.  Carotid bifurcation calcifications. IMPRESSION: CT HEAD 1. Age-indeterminate infarct versus prominent chronic microvascular changes genu of the left internal capsule. Otherwise no CT evidence of large acute infarct. 2. No skull fracture or intracranial hemorrhage. 3. Prominent chronic microvascular changes. 4. Global atrophy. CT CERVICAL SPINE 1. Mild rotation head/C1 upon C2 may be related to head position. 2. No cervical spine fracture or abnormal prevertebral soft tissue swelling. 3. Spondylotic changes most notable C4-5 through C6-7. No high-grade spinal stenosis. Electronically Signed   By: Lacy Duverney M.D.   On: 05/06/2018 15:27   Ct Pelvis Wo Contrast  Result Date: 05/06/2018 CLINICAL DATA:  Patient status post fall at some point over the past 3 days. Pelvic pain. Initial encounter. EXAM: CT PELVIS WITHOUT CONTRAST TECHNIQUE: Multidetector CT imaging of the pelvis was performed following the standard protocol without intravenous contrast. COMPARISON:  CT abdomen and pelvis 03/24/2014. FINDINGS: Urinary Tract: The urinary bladder is completely decompressed with a Foley catheter in place. Bowel: Sigmoid diverticulosis is noted. Imaged bowel loops are otherwise unremarkable. Vascular/Lymphatic: Atherosclerotic vascular disease is seen. No lymphadenopathy. Reproductive:  No mass or other significant abnormality Other:  No fluid collection. Musculoskeletal: The hips are located. No fracture  is identified. Small bone islands in the  right sacrum and left ilium are unchanged. Mild right hip joint space narrowing is identified. Mild degenerative change is also seen at the symphysis pubis. There is some facet arthropathy and a disc bulge at L5-S1. IMPRESSION: Negative for fracture.  No acute abnormality. Lower lumbar spondylosis. Mild degenerative change right hip and symphysis pubis also noted. Atherosclerosis. Diverticulosis. Electronically Signed   By: Drusilla Kanner M.D.   On: 05/06/2018 15:15   Mr Brain Wo Contrast  Result Date: 05/07/2018 CLINICAL DATA:  Ataxia EXAM: MRI HEAD WITHOUT CONTRAST MRA HEAD WITHOUT CONTRAST TECHNIQUE: Multiplanar, multiecho pulse sequences of the brain and surrounding structures were obtained without intravenous contrast. Angiographic images of the head were obtained using MRA technique without contrast. COMPARISON:  Head CT 05/06/2018 FINDINGS: MRI HEAD FINDINGS BRAIN: There is abnormal diffusion restriction of the left lentiform nucleus, posterior limb of the left internal capsule and medial left temporal lobe. The midline structures are normal. Early confluent hyperintense T2-weighted signal of the periventricular and deep white matter, most commonly due to chronic ischemic microangiopathy. Generalized atrophy without lobar predilection. Single focus of chronic microhemorrhage in the left frontal lobe. SKULL AND UPPER CERVICAL SPINE: The visualized skull base, calvarium, upper cervical spine and extracranial soft tissues are normal. SINUSES/ORBITS: No fluid levels or advanced mucosal thickening. No mastoid or middle ear effusion. The orbits are normal. MRA HEAD FINDINGS Intracranial internal carotid arteries: There is diminished flow related enhancement of the left internal carotid artery lacerum, cavernous and supraclinoid segments. There is a central defect within the cavernous segment and at the carotid terminus. The right ICA is normal. Anterior cerebral arteries: Normal. Middle cerebral arteries:  The right MCA is normal. The left middle cerebral artery is patent proximally. However, there is limited flow related enhancement of the distal branches. Posterior communicating arteries: Present on the left. Posterior cerebral arteries: Normal. Basilar artery: Normal. Vertebral arteries: Codominant normal. Superior cerebellar arteries: Normal. Inferior cerebellar arteries: Normal. IMPRESSION: 1. 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 No hemorrhage or mass effect. 2. Diminished flow related enhancement of the left internal carotid artery at the skull base with suspected acute thrombus. The left middle cerebral artery is normal proximally, but there is minimal flow related enhancement of the distal branches. 3. Chronic ischemic microangiopathy and generalized atrophy. Electronically Signed   By: Deatra Robinson M.D.   On: 05/07/2018 01:58   Ct Abdomen Pelvis W Contrast  Result Date: 05/10/2018 CLINICAL DATA:  84 year old female with recent left MCA cerebral infarct. Abdominal pain. EXAM: CT ABDOMEN AND PELVIS WITH CONTRAST TECHNIQUE: Multidetector CT imaging of the abdomen and pelvis was performed using the standard protocol following bolus administration of intravenous contrast. CONTRAST:  75mL OMNIPAQUE IOHEXOL 300 MG/ML  SOLN COMPARISON:  Pelvis CT 05/06/2018.  CT chest and abdomen 05/06/2014 FINDINGS: Lower chest: Cardiomegaly appears mildly progressed since 2015. No pericardial effusion. Small layering right pleural effusion. Trace layering left pleural effusion. Mild compressive atelectasis at both lung bases. Hepatobiliary: Benign hepatic cysts are stable since 2015. There may be some vicarious excretion of contrast to the gallbladder. No pericholecystic inflammation. Central hepatic bile ducts appear stable since 2015. Pancreas: Negative. Spleen: Negative. Adrenals/Urinary Tract: Normal adrenal glands. The kidneys appear stable and  within normal limits. Renal enhancement and contrast excretion is symmetric. Proximal ureters are decompressed. The urinary bladder is distended despite the presence of a Foley catheter. Estimated bladder volume  564 milliliters. Small volume of gas within the bladder. No perivesical stranding. Stomach/Bowel: Mild presacral stranding and air-fluid level in the rectum but no rectal wall thickening. Diverticulosis in the proximal sigmoid colon is moderate, but there is no active inflammation identified. Redundant proximal sigmoid. Negative descending colon. Negative transverse colon. Redundant flexures. Negative right colon. The cecum is on a lax mesentery. There appears to be a normal appendix on sagittal image 39. Decompressed and negative terminal ileum. No dilated small bowel. Negative stomach and duodenum. No abdominal free air.  No abdominal free fluid. Vascular/Lymphatic: Aortoiliac calcified atherosclerosis. Ectasia of the infrarenal abdominal aorta has mildly progressed, now 25 millimeters diameter. Major arterial structures are patent. Portal venous system is patent. No lymphadenopathy. Reproductive: Negative. Other: Small volume pelvic free fluid with simple fluid density on the right (series 2, image 64). Musculoskeletal: No acute osseous abnormality identified. IMPRESSION: 1. A small volume of pelvic free fluid with presacral stranding is abnormal but nonspecific. There is no convincing bowel inflammation or other inflammatory process in the abdomen or pelvis. 2. Distended urinary bladder despite the presence of a Foley catheter. Estimated bladder volume 564 mL. 3. Small layering pleural effusions with atelectasis greater on the right. 4. Sigmoid diverticulosis without active inflammation. 5. Aortic Atherosclerosis (ICD10-I70.0) and ectatic abdominal aorta at risk for aneurysm development. Recommend followup by Ultrasound in 5 years. This recommendation follows ACR consensus guidelines: White Paper of the  ACR Incidental Findings Committee II on Vascular Findings. J Am Coll Radiol 2013; 10:789-794. Electronically Signed   By: Odessa Fleming M.D.   On: 05/10/2018 13:53   US Carotid Bilateral (at [facility] And [facility] Only)  Result Date: 05/07/2018 CLINICAL DATA:  84 year old female with symptoms of transient ischemic attack EXAM: BILATERAL CAROTID DUPLEX ULTRASOUND TECHNIQUE: Wallace Cullens scale imaging, color Doppler and duplex ultrasound were performed of bilateral carotid and vertebral arteries in the neck. COMPARISON:  84 year old female with symptoms of transient ischemic attack FINDINGS: Criteria: Quantification of carotid stenosis is based on velocity parameters that correlate the residual internal carotid diameter with NASCET-based stenosis levels, using the diameter of the distal internal carotid lumen as the denominator for stenosis measurement. The following velocity measurements were obtained: RIGHT ICA: 74/18 cm/sec CCA: 108/16 cm/sec SYSTOLIC ICA/CCA RATIO:  0.7 ECA:  132 cm/sec LEFT ICA: 41/4 cm/sec CCA: 9 9/7 cm/sec SYSTOLIC ICA/CCA RATIO:  0.4 ECA:  137 cm/sec RIGHT CAROTID ARTERY: No significant atherosclerotic plaque or evidence of stenosis in the internal carotid artery. RIGHT VERTEBRAL ARTERY:  Patent with normal antegrade flow. LEFT CAROTID ARTERY: Mild focal heterogeneous atherosclerotic plaque in the proximal internal carotid artery. By peak systolic velocity criteria, the estimated stenosis remains less than 50%. LEFT VERTEBRAL ARTERY:  Patent with normal antegrade flow. IMPRESSION: 1. Mild (1-49%) stenosis proximal left internal carotid artery secondary to focal heterogeneous atherosclerotic plaque. 2. No significant atherosclerotic plaque or evidence of stenosis in the right internal carotid artery. 3. Vertebral arteries are patent with normal antegrade flow. Signed, Sterling Big, MD, RPVI [facility] Electronically Signed   By: Malachy Moan  M.D.   On: 05/07/2018 10:48   Dg Chest Port 1 View  Result Date: 05/12/2018 CLINICAL DATA:  Pneumonia EXAM: PORTABLE CHEST 1 VIEW COMPARISON:  05/10/2018; 05/06/2018; chest CT-05/06/2014 FINDINGS: Grossly unchanged enlarged cardiac silhouette and mediastinal contours with atherosclerotic plaque within the thoracic aorta. The lungs are hyperexpanded with flattening of the diaphragms and blunting of the bilateral costophrenic angles. No focal airspace opacities. Trace  pleural effusions are not excluded. No evidence of edema. No pneumothorax. No acute osseus abnormalities. IMPRESSION: Similar findings of cardiomegaly, lung hyperexpansion and suspected trace bilateral effusions without superimposed acute cardiopulmonary disease. Specifically, no evidence of pulmonary edema or focal airspace opacities to suggest pneumonia. Electronically Signed   By: Simonne Come M.D.   On: 05/12/2018 07:35   Dg Chest Port 1 View  Result Date: 05/10/2018 CLINICAL DATA:  SVT EXAM: PORTABLE CHEST 1 VIEW COMPARISON:  05/06/2018 FINDINGS: The lungs are hyperinflated. The heart is enlarged. There is atherosclerotic calcification of the thoracic aorta. Suspect small bilateral pleural effusions. No pulmonary edema or consolidation. Skin folds overlie the chest. IMPRESSION: Stable cardiomegaly. Small bilateral effusions.  No edema. Electronically Signed   By: Norva Pavlov M.D.   On: 05/10/2018 22:11   Dg Chest Portable 1 View  Result Date: 05/06/2018 CLINICAL DATA:  Right-sided weakness, fall EXAM: PORTABLE CHEST 1 VIEW COMPARISON:  03/24/2014, CT 05/06/2014 FINDINGS: The patient's hand obscures the left lower chest. Small right-sided pleural effusion. Enlarged cardiomediastinal silhouette with vascular congestion and hazy atelectasis or edema at the bases. No pneumothorax. IMPRESSION: 1. Cardiomegaly with vascular congestion and hazy bibasilar atelectasis or mild edema. 2. Tiny right pleural effusion Electronically Signed    By: Jasmine Pang M.D.   On: 05/06/2018 14:58   Mr Maxine Glenn Head/brain ZO Cm  Result Date: 05/07/2018 CLINICAL DATA:  Ataxia EXAM: MRI HEAD WITHOUT CONTRAST MRA HEAD WITHOUT CONTRAST TECHNIQUE: Multiplanar, multiecho pulse sequences of the brain and surrounding structures were obtained without intravenous contrast. Angiographic images of the head were obtained using MRA technique without contrast. COMPARISON:  Head CT 05/06/2018 FINDINGS: MRI HEAD FINDINGS BRAIN: There is abnormal diffusion restriction of the left lentiform nucleus, posterior limb of the left internal capsule and medial left temporal lobe. The midline structures are normal. Early confluent hyperintense T2-weighted signal of the periventricular and deep white matter, most commonly due to chronic ischemic microangiopathy. Generalized atrophy without lobar predilection. Single focus of chronic microhemorrhage in the left frontal lobe. SKULL AND UPPER CERVICAL SPINE: The visualized skull base, calvarium, upper cervical spine and extracranial soft tissues are normal. SINUSES/ORBITS: No fluid levels or advanced mucosal thickening. No mastoid or middle ear effusion. The orbits are normal. MRA HEAD FINDINGS Intracranial internal carotid arteries: There is diminished flow related enhancement of the left internal carotid artery lacerum, cavernous and supraclinoid segments. There is a central defect within the cavernous segment and at the carotid terminus. The right ICA is normal. Anterior cerebral arteries: Normal. Middle cerebral arteries: The right MCA is normal. The left middle cerebral artery is patent proximally. However, there is limited flow related enhancement of the distal branches. Posterior communicating arteries: Present on the left. Posterior cerebral arteries: Normal. Basilar artery: Normal. Vertebral arteries: Codominant normal. Superior cerebellar arteries: Normal. Inferior cerebellar arteries: Normal. IMPRESSION: 1. Acute infarct of the left  lentiform nucleus, posterior limb of the internal capsule and medial left temporal lobe. No hemorrhage or mass effect. 2. Diminished flow related enhancement of the left internal carotid artery at the skull base with suspected acute thrombus. The left middle cerebral artery is normal proximally, but there is minimal flow related enhancement of the distal branches. 3. Chronic ischemic microangiopathy and generalized atrophy. Electronically Signed   By: Deatra Robinson M.D.   On: 05/07/2018 01:58     Assessment and Recommendation  84 y.o. female with essential hypertension mixed hyperlipidemia and acute stroke with atrial fibrillation with rapid ventricular rate now with what  appears to be atrial tachycardia at controlled ventricular rate without evidence of myocardial infarction and/or congestive heart failure 1.  Continue oral amiodarone for heart rate control and maintenance of normal rhythm 2.  Continue diltiazem for heart rate control and blood pressure control 3.  Anticoagulation for further risk reduction in stroke with atrial fibrillation 4.  Further cardiac diagnostics necessary at this time 5.  Begin ambulation when able  Signed, Arnoldo Hooker M.D. FACC

## 2018-05-12 NOTE — Progress Notes (Signed)
   05/12/18 1700  Clinical Encounter Type  Visited With Patient and family together  Visit Type Follow-up;Spiritual support  Referral From Chaplain Kingsley Spittle)  Recommendations Follow-up, as needed.  Spiritual Encounters  Spiritual Needs Emotional;Prayer   Chaplain followed up with the patient and family, as per request from [facility]. Patient was improved but drifted in and out of sleep. Chaplain provided a blessing for the patient and continued to talk with her daughter who is receiving cancer treatment. Chaplain  praised her new bold look. The daughter is empowering herself and reclaiming her narrative beginning with shaving her head. Chaplain offered to return as needed.

## 2018-05-12 NOTE — Progress Notes (Signed)
Transferred to 115 via bed.

## 2018-05-12 NOTE — Progress Notes (Signed)
  Speech Language Pathology Treatment: Dysphagia  Patient Details Name: Jenny Molina MRN: [id number] DOB: 30-May-1924 Today's Date: 05/12/2018 Time: 1318-1400 SLP Time Calculation (min) (ACUTE ONLY): 42 min  Assessment / Plan / Recommendation Clinical Impression  Pt seen for ongoing assessment of toleration of diet. A Dysphagia level 1 w/ Nectar consistency liquids was initiated prior to the weekend w/ fair toleration of such per NSG report. Pt appears to be improving overall w/ no decline in Pulmonary status per chart notes; MD. She appears more awake/alert and verbal w/ SLP; eyes open more and less drowsy. She continues to exhibit moderate expressive language deficits but answers basic questions during tasks somewhat accurately. She indicated regularly when she was "ready" for more sips.  Pt was positioned more upright and given trials of Nectar consistency liquids via TSP then Cup(w/ pt helping to hold cup to achieve more independence and safety w/ drinking). She consumed trials w/ clear vocal quality immediately following the swallowing and no decline in respiratory effort/status overall post the trials. O2 sats remained in the 90s(baseline). No cough or throat clearing noted. Pt did well holding the cup to take sips needing only min support d/t overall weakness(LU). Oral phase c/b timely bolus management and more effective swallowing/clearing - she appeared to need less f/u, Dry swallows to completely finish the swallow and clear residue. Pt cleared orally post swallows. No other trial consistencies(food) assessed today.  Recommend continue a Dysphagia level 1 w/ Nectar consistency liquids via Cup; Pills in Puree as able to tolerate for safe swallowing. ST services will continue to f/u w/ toleration of diet and trials to upgrade diet consistency as able, safe. Will f/u w/ Family education and information. NSG updated.    HPI HPI: Pt is a 84 y.o. female with a known history of hypertension,  hypothyroidism who is a resident of an independent living facility was found on the floor with last well-known 3 days back.  Patient is drowsy but answers a few questions.  From history from her facility she has been on the floor for 2 to 3 days.  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  She appears more alert today; R neglect but w/ support w/ pillow and towel rolls, she was able to attain midline status.  She acknowledges family members and SLP w/ few mumbled verbalizations; suspect experessive Aphasia.  She followed through w/ general tasks during the evaluation and engaged appropriately.       SLP Plan  Continue with current plan of care       Recommendations  Diet recommendations: Dysphagia 1 (puree);Nectar-thick liquid Liquids provided via: Teaspoon;Cup(mostly by Cup) Medication Administration: Crushed with puree Supervision: Patient able to self feed;Staff to assist with self feeding;Full supervision/cueing for compensatory strategies Compensations: Minimize environmental distractions;Slow rate;Small sips/bites;Lingual sweep for clearance of pocketing;Multiple dry swallows after each bite/sip;Follow solids with liquid Postural Changes and/or Swallow Maneuvers: Seated upright 90 degrees;Upright 30-60 min after meal                General recommendations: (Dietician following; Palliative Care following) Oral Care Recommendations: Oral care BID;Staff/trained caregiver to provide oral care Follow up Recommendations: Skilled Nursing facility(TBD) SLP Visit Diagnosis: Dysphagia, oropharyngeal phase (R13.12) Plan: Continue with current plan of care       GO                Jenny Som, MS, CCC-SLP [name] 05/12/2018, 3:16 PM

## 2018-05-12 NOTE — Plan of Care (Signed)
Unable to teach pt concerning stroke education due to pt's altered mental status

## 2018-05-12 NOTE — Care Management Note (Signed)
Case Management Note  Patient Details  Name: ADALYN PENNOCK MRN: [id number] Date of Birth: June 05, 1924  Subjective/Objective:        RNCM consulted for Home Health needs.  PT recommendation for patient to go to SNF. CSW is following for bed placement.  Family has chosen [facility].  RNCM signing off.               Action/Plan:  SNF Expected Discharge Date:                  Expected Discharge Plan:  Skilled Nursing Facility  In-House Referral:  Clinical Social Work  Discharge planning Services  CM Consult  Post Acute Care Choice:    Choice offered to:     DME Arranged:    DME Agency:     HH Arranged:    HH Agency:     Status of Service:  Completed, signed off  If discussed at [employer], dates discussed:    Additional Comments:  Allayne Butcher, RN 05/12/2018, 10:55 AM

## 2018-05-13 DIAGNOSIS — Z515 Encounter for palliative care: Secondary | ICD-10-CM

## 2018-05-13 DIAGNOSIS — L8990 Pressure ulcer of unspecified site, unspecified stage: Secondary | ICD-10-CM

## 2018-05-13 MED ORDER — DILTIAZEM HCL 60 MG PO TABS: 60.0000 mg | ORAL_TABLET | Freq: Four times a day (QID) | ORAL | Status: AC

## 2018-05-13 MED ORDER — ACETAMINOPHEN 325 MG PO TABS: 650.0000 mg | ORAL_TABLET | ORAL | Status: AC | PRN

## 2018-05-13 MED ORDER — MORPHINE SULFATE (CONCENTRATE) 10 MG/0.5ML PO SOLN: 10.0000 mg | ORAL | Status: DC | PRN

## 2018-05-13 MED ORDER — AMIODARONE HCL 200 MG PO TABS: 200.0000 mg | ORAL_TABLET | Freq: Two times a day (BID) | ORAL | Status: AC

## 2018-05-13 MED ORDER — MORPHINE SULFATE (CONCENTRATE) 10 MG/0.5ML PO SOLN: 10.0000 mg | ORAL | Status: AC | PRN

## 2018-05-13 NOTE — Care Management Important Message (Signed)
Important Message  Patient Details  Name: Jenny Molina MRN: [id number] Date of Birth: March 05, 1924   Medicare Important Message Given:  Yes    Olegario Messier A Estella Malatesta 05/13/2018, 11:35 AM

## 2018-05-13 NOTE — Consult Note (Signed)
[place]  Telephone:([phone] [phone]   Name: Jenny Molina Date: 05/13/2018 MRN: [id number]  DOB: 1923/12/05  Patient Care Team: Idelle Crouch, MD as PCP - General (Internal Medicine)    REASON FOR CONSULTATION: Palliative Care consult requested for this 84 y.o. female with multiple medical problems including hypertension, hypothyroidism, who was admitted to the hospital on 05/06/2018 with acute CVA and rhabdomyolysis.  She had been found lying on the floor of her apartment for an unknown amount of time.  MRI of the brain revealed an acute infarct involving the left lentiform Nicholas, posterior limb of the internal capsule, and medial left temporal lobe.  MRA showed possible acute thrombus in the left ICA.  Patient has right-sided hemiparesis and dysphasia as a result of the stroke.  She has poor oral intake and is only eating sips and bites.  Patient's hospitalization has been complicated by a flutter with RVR requiring a short stay in the ICU.  Palliative care was consulted to help establish goals of care.   SOCIAL HISTORY:    She is widowed.  She lives at [facility] independently.  She has a son and daughter, both of whom are involved in her care.  ADVANCE DIRECTIVES:  Patient's daughter is her healthcare power of attorney.  Patient's son is her [place].  Patient has a living will.  CODE STATUS: DNR  PAST MEDICAL HISTORY: Past Medical History:  Diagnosis Date  . HTN (hypertension)   . Hypothyroidism     PAST SURGICAL HISTORY: History reviewed. No pertinent surgical history.  HEMATOLOGY/ONCOLOGY HISTORY:   No history exists.    ALLERGIES:  is allergic to benicar [olmesartan]; ciprofloxacin; hydralazine; hydrochlorothiazide; penicillins; and sulfa antibiotics.  MEDICATIONS:  Current Facility-Administered Medications  Medication Dose Route Frequency Provider Last Rate Last Dose  .  acetaminophen (TYLENOL) tablet 650 mg  650 mg Oral Q4H PRN Hillary Bow, MD       Or  . acetaminophen (TYLENOL) solution 650 mg  650 mg Per Tube Q4H PRN Sudini, Alveta Heimlich, MD       Or  . acetaminophen (TYLENOL) suppository 650 mg  650 mg Rectal Q4H PRN Sudini, Alveta Heimlich, MD      . amiodarone (PACERONE) tablet 200 mg  200 mg Oral BID Corey Skains, MD   200 mg at 05/13/18 0931  . aspirin suppository 300 mg  300 mg Rectal Daily Hillary Bow, MD   300 mg at 05/13/18 5170   Or  . aspirin tablet 325 mg  325 mg Oral Daily Hillary Bow, MD   325 mg at 05/12/18 0850  . atorvastatin (LIPITOR) tablet 40 mg  40 mg Oral q1800 Dustin Flock, MD   40 mg at 05/12/18 1823  . bisacodyl (DULCOLAX) suppository 10 mg  10 mg Rectal Daily PRN Cassandria Santee, MD   10 mg at 05/12/18 1217  . dextrose 5 %-0.9 % sodium chloride infusion   Intravenous Continuous Loletha Grayer, MD 50 mL/hr at 05/13/18 0038    . diltiazem (CARDIZEM) tablet 60 mg  60 mg Oral Q6H Loletha Grayer, MD   60 mg at 05/13/18 0174  . enoxaparin (LOVENOX) injection 40 mg  40 mg Subcutaneous Q24H Hillary Bow, MD   40 mg at 05/12/18 2124  . levothyroxine (SYNTHROID, LEVOTHROID) tablet 50 mcg  50 mcg Oral QAC breakfast Hillary Bow, MD   50 mcg at 05/13/18 0932  . LORazepam (ATIVAN) injection 0.5 mg  0.5  mg Intravenous Q6H PRN Sudini, Alveta Heimlich, MD      . metoprolol tartrate (LOPRESSOR) injection 5 mg  5 mg Intravenous Q1H PRN Loletha Grayer, MD   5 mg at 05/10/18 1556  . polyethylene glycol (MIRALAX / GLYCOLAX) packet 17 g  17 g Oral Daily Samaan, Maged, MD   17 g at 05/12/18 1214    VITAL SIGNS: BP 126/67 (BP Location: Left Arm)   Pulse 93   Temp 98.5 F (36.9 C) (Oral) Comment: 09.5  Resp 14   Ht 5' 5"  (1.651 m)   Wt 116 lb 8 oz (52.8 kg)   SpO2 95%   BMI 19.39 kg/m  Filed Weights   05/10/18 1900 05/12/18 0900 05/12/18 1552  Weight: 112 lb 10.5 oz (51.1 kg) 114 lb 10.2 oz (52 kg) 116 lb 8 oz (52.8 kg)    Estimated body mass  index is 19.39 kg/m as calculated from the following:   Height as of this encounter: 5' 5"  (1.651 m).   Weight as of this encounter: 116 lb 8 oz (52.8 kg).  LABS: CBC:    Component Value Date/Time   WBC 12.0 (H) 05/12/2018 0411   HGB 14.8 05/12/2018 0411   HGB 13.1 06/03/2014 1134   HCT 43.9 05/12/2018 0411   HCT 39.7 06/03/2014 1134   PLT 279 05/12/2018 0411   PLT 242 06/03/2014 1134   MCV 89.6 05/12/2018 0411   MCV 96 06/03/2014 1134   NEUTROABS 9.5 (H) 05/12/2018 0411   LYMPHSABS 1.3 05/12/2018 0411   MONOABS 0.8 05/12/2018 0411   EOSABS 0.2 05/12/2018 0411   BASOSABS 0.0 05/12/2018 0411   Comprehensive Metabolic Panel:    Component Value Date/Time   NA 138 05/12/2018 0411   NA 139 03/24/2014 0146   K 3.6 05/12/2018 0411   K 3.5 03/24/2014 0146   CL 105 05/12/2018 0411   CL 99 03/24/2014 0146   CO2 26 05/12/2018 0411   CO2 32 03/24/2014 0146   BUN 19 05/12/2018 0411   BUN 13 03/24/2014 0146   CREATININE 0.64 05/12/2018 0411   CREATININE 0.74 03/24/2014 0146   GLUCOSE 111 (H) 05/12/2018 0411   GLUCOSE 118 (H) 03/24/2014 0146   CALCIUM 8.7 (L) 05/12/2018 0411   CALCIUM 8.7 03/24/2014 0146   AST 20 05/10/2018 2129   ALT 19 05/10/2018 2129   ALKPHOS 47 05/10/2018 2129   BILITOT 1.3 (H) 05/10/2018 2129   PROT 5.9 (L) 05/10/2018 2129   ALBUMIN 3.1 (L) 05/10/2018 2129    RADIOGRAPHIC STUDIES: Ct Head Wo Contrast  Result Date: 05/10/2018 CLINICAL DATA:  84 year old female with distal left ICA thrombosis, left lentiform, internal capsule and medial left temporal lobe infarct. EXAM: CT HEAD WITHOUT CONTRAST TECHNIQUE: Contiguous axial images were obtained from the base of the skull through the vertex without intravenous contrast. COMPARISON:  CTA neck and brain MRI 05/07/2018.  Head CT 05/06/2018. FINDINGS: Brain: Hypodensity from the mesial left temporal lobe through the left lentiform nuclei tracking toward the left corona radiata corresponds well to the restricted  diffusion on 05/07/2018. No associated hemorrhage or mass effect. Patchy white matter hypodensity elsewhere appears stable. No midline shift, ventriculomegaly, mass effect, evidence of mass lesion, or intracranial hemorrhage. Vascular: Stable noncontrast CT appearance. Skull: Negative. Sinuses/Orbits: Visualized paranasal sinuses and mastoids are stable and well pneumatized. Other: Stable orbit and scalp soft tissues. IMPRESSION: 1. Stable/expected appearance of the deep left MCA territory infarct seen by MRI on 05/07/2018. No associated hemorrhage or mass effect. 2. No  new intracranial abnormality. Electronically Signed   By: Genevie Ann M.D.   On: 05/10/2018 13:44   Ct Head Wo Contrast  Result Date: 05/06/2018 CLINICAL DATA:  84 year old female fell and presumed to be on floor for 3 days per neighbor. Right-sided weakness. Initial encounter. EXAM: CT HEAD WITHOUT CONTRAST CT CERVICAL SPINE WITHOUT CONTRAST TECHNIQUE: Multidetector CT imaging of the head and cervical spine was performed following the standard protocol without intravenous contrast. Multiplanar CT image reconstructions of the cervical spine were also generated. COMPARISON:  12/25/2013 CT. FINDINGS: CT HEAD FINDINGS Brain: Age-indeterminate infarct versus prominent chronic microvascular changes genu of the left internal capsule. Otherwise no CT evidence of large acute infarct. No intracranial hemorrhage. Prominent chronic microvascular changes. Global atrophy. No intracranial mass lesion noted on this unenhanced exam. Vascular: No hyperdense vessel. Skull: No skull fracture. Sinuses/Orbits: Post lens replacement. No acute orbital abnormality. Mucosal thickening ethmoid sinus air cells. Other: Mastoid air cells and middle ear cavities are clear. CT CERVICAL SPINE FINDINGS Alignment: Mild rotation head and C1 upon C2. Skull base and vertebrae: No cervical spine fracture. Soft tissues and spinal canal: No abnormal prevertebral soft tissue swelling. Disc  levels: Cervical spondylotic changes most notable C4-5 through C6-7. No high-grade spinal stenosis. Upper chest: No worrisome abnormality. Other: No worrisome neck mass.  Carotid bifurcation calcifications. IMPRESSION: CT HEAD 1. Age-indeterminate infarct versus prominent chronic microvascular changes genu of the left internal capsule. Otherwise no CT evidence of large acute infarct. 2. No skull fracture or intracranial hemorrhage. 3. Prominent chronic microvascular changes. 4. Global atrophy. CT CERVICAL SPINE 1. Mild rotation head/C1 upon C2 may be related to head position. 2. No cervical spine fracture or abnormal prevertebral soft tissue swelling. 3. Spondylotic changes most notable C4-5 through C6-7. No high-grade spinal stenosis. Electronically Signed   By: Genia Del M.D.   On: 05/06/2018 15:27   Ct Angio Neck W Or Wo Contrast  Result Date: 05/07/2018 CLINICAL DATA:  84 y/o  F; follow-up of stroke. EXAM: CT ANGIOGRAPHY NECK TECHNIQUE: Multidetector CT imaging of the neck was performed using the standard protocol during bolus administration of intravenous contrast. Multiplanar CT image reconstructions and MIPs were obtained to evaluate the vascular anatomy. Carotid stenosis measurements (when applicable) are obtained utilizing NASCET criteria, using the distal internal carotid diameter as the denominator. CONTRAST:  54m OMNIPAQUE IOHEXOL 350 MG/ML SOLN COMPARISON:  05/07/2018 carotid ultrasound. 05/07/2018 MRI of the head and MRA of the head. FINDINGS: Aortic arch: Common origin of right brachiocephalic and left common carotid arteries. Imaged portion shows no evidence of aneurysm or dissection. No significant stenosis of the major arch vessel origins. Moderate calcific atherosclerosis. Right carotid system: No evidence of dissection, stenosis (50% or greater) or occlusion. Left carotid system: No evidence of dissection, stenosis (50% or greater) or occlusion in the neck. Non stenotic mild calcified  plaque of the carotid bifurcation. Thrombotic occlusion of the left internal carotid artery starting from the distal cavernous segment to terminus, circle-of-Willis not within the field of view (series 8, image 71). Vertebral arteries: Codominant. No evidence of dissection, stenosis (50% or greater) or occlusion. Skeleton: Mild-to-moderate cervical spondylosis with multilevel disc and facet degenerative changes. No high-grade bony canal stenosis. Other neck: Negative. Upper chest: Biapical pleuroparenchymal scarring with calcification. Small right pleural effusion. IMPRESSION: 1. 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 Visible segments of the ICA are stable from the prior MRA of the head  given differences in technique. 2. Patent carotid and vertebral arteries of the neck. No evidence of dissection, hemodynamically significant stenosis by NASCET criteria, or occlusion in the neck. 3. Small right pleural effusion. These results will be called to the ordering clinician or representative by the Radiologist Assistant, and communication documented in the PACS or zVision Dashboard. Electronically Signed   By: Kristine Garbe M.D.   On: 05/07/2018 13:56   Ct Cervical Spine Wo Contrast  Result Date: 05/06/2018 CLINICAL DATA:  84 year old female fell and presumed to be on floor for 3 days per neighbor. Right-sided weakness. Initial encounter. EXAM: CT HEAD WITHOUT CONTRAST CT CERVICAL SPINE WITHOUT CONTRAST TECHNIQUE: Multidetector CT imaging of the head and cervical spine was performed following the standard protocol without intravenous contrast. Multiplanar CT image reconstructions of the cervical spine were also generated. COMPARISON:  12/25/2013 CT. FINDINGS: CT HEAD FINDINGS Brain: Age-indeterminate infarct versus prominent chronic microvascular changes genu of the left internal capsule. Otherwise no CT  evidence of large acute infarct. No intracranial hemorrhage. Prominent chronic microvascular changes. Global atrophy. No intracranial mass lesion noted on this unenhanced exam. Vascular: No hyperdense vessel. Skull: No skull fracture. Sinuses/Orbits: Post lens replacement. No acute orbital abnormality. Mucosal thickening ethmoid sinus air cells. Other: Mastoid air cells and middle ear cavities are clear. CT CERVICAL SPINE FINDINGS Alignment: Mild rotation head and C1 upon C2. Skull base and vertebrae: No cervical spine fracture. Soft tissues and spinal canal: No abnormal prevertebral soft tissue swelling. Disc levels: Cervical spondylotic changes most notable C4-5 through C6-7. No high-grade spinal stenosis. Upper chest: No worrisome abnormality. Other: No worrisome neck mass.  Carotid bifurcation calcifications. IMPRESSION: CT HEAD 1. Age-indeterminate infarct versus prominent chronic microvascular changes genu of the left internal capsule. Otherwise no CT evidence of large acute infarct. 2. No skull fracture or intracranial hemorrhage. 3. Prominent chronic microvascular changes. 4. Global atrophy. CT CERVICAL SPINE 1. Mild rotation head/C1 upon C2 may be related to head position. 2. No cervical spine fracture or abnormal prevertebral soft tissue swelling. 3. Spondylotic changes most notable C4-5 through C6-7. No high-grade spinal stenosis. Electronically Signed   By: Genia Del M.D.   On: 05/06/2018 15:27   Ct Pelvis Wo Contrast  Result Date: 05/06/2018 CLINICAL DATA:  Patient status post fall at some point over the past 3 days. Pelvic pain. Initial encounter. EXAM: CT PELVIS WITHOUT CONTRAST TECHNIQUE: Multidetector CT imaging of the pelvis was performed following the standard protocol without intravenous contrast. COMPARISON:  CT abdomen and pelvis 03/24/2014. FINDINGS: Urinary Tract: The urinary bladder is completely decompressed with a Foley catheter in place. Bowel: Sigmoid diverticulosis is noted.  Imaged bowel loops are otherwise unremarkable. Vascular/Lymphatic: Atherosclerotic vascular disease is seen. No lymphadenopathy. Reproductive:  No mass or other significant abnormality Other:  No fluid collection. Musculoskeletal: The hips are located. No fracture is identified. Small bone islands in the right sacrum and left ilium are unchanged. Mild right hip joint space narrowing is identified. Mild degenerative change is also seen at the symphysis pubis. There is some facet arthropathy and a disc bulge at L5-S1. IMPRESSION: Negative for fracture.  No acute abnormality. Lower lumbar spondylosis. Mild degenerative change right hip and symphysis pubis also noted. Atherosclerosis. Diverticulosis. Electronically Signed   By: Inge Rise M.D.   On: 05/06/2018 15:15   Mr Brain Wo Contrast  Result Date: 05/07/2018 CLINICAL DATA:  Ataxia EXAM: MRI HEAD WITHOUT CONTRAST MRA HEAD WITHOUT CONTRAST TECHNIQUE: Multiplanar, multiecho pulse sequences of the brain and surrounding  structures were obtained without intravenous contrast. Angiographic images of the head were obtained using MRA technique without contrast. COMPARISON:  Head CT 05/06/2018 FINDINGS: MRI HEAD FINDINGS BRAIN: There is abnormal diffusion restriction of the left lentiform nucleus, posterior limb of the left internal capsule and medial left temporal lobe. The midline structures are normal. Early confluent hyperintense T2-weighted signal of the periventricular and deep white matter, most commonly due to chronic ischemic microangiopathy. Generalized atrophy without lobar predilection. Single focus of chronic microhemorrhage in the left frontal lobe. SKULL AND UPPER CERVICAL SPINE: The visualized skull base, calvarium, upper cervical spine and extracranial soft tissues are normal. SINUSES/ORBITS: No fluid levels or advanced mucosal thickening. No mastoid or middle ear effusion. The orbits are normal. MRA HEAD FINDINGS Intracranial internal carotid  arteries: There is diminished flow related enhancement of the left internal carotid artery lacerum, cavernous and supraclinoid segments. There is a central defect within the cavernous segment and at the carotid terminus. The right ICA is normal. Anterior cerebral arteries: Normal. Middle cerebral arteries: The right MCA is normal. The left middle cerebral artery is patent proximally. However, there is limited flow related enhancement of the distal branches. Posterior communicating arteries: Present on the left. Posterior cerebral arteries: Normal. Basilar artery: Normal. Vertebral arteries: Codominant normal. Superior cerebellar arteries: Normal. Inferior cerebellar arteries: Normal. IMPRESSION: 1. 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 No hemorrhage or mass effect. 2. Diminished flow related enhancement of the left internal carotid artery at the skull base with suspected acute thrombus. The left middle cerebral artery is normal proximally, but there is minimal flow related enhancement of the distal branches. 3. Chronic ischemic microangiopathy and generalized atrophy. Electronically Signed   By: Ulyses Jarred M.D.   On: 05/07/2018 01:58   Ct Abdomen Pelvis W Contrast  Result Date: 05/10/2018 CLINICAL DATA:  84 year old female with recent left MCA cerebral infarct. Abdominal pain. EXAM: CT ABDOMEN AND PELVIS WITH CONTRAST TECHNIQUE: Multidetector CT imaging of the abdomen and pelvis was performed using the standard protocol following bolus administration of intravenous contrast. CONTRAST:  51m OMNIPAQUE IOHEXOL 300 MG/ML  SOLN COMPARISON:  Pelvis CT 05/06/2018.  CT chest and abdomen 05/06/2014 FINDINGS: Lower chest: Cardiomegaly appears mildly progressed since 2015. No pericardial effusion. Small layering right pleural effusion. Trace layering left pleural effusion. Mild compressive atelectasis at both lung bases. Hepatobiliary: Benign  hepatic cysts are stable since 2015. There may be some vicarious excretion of contrast to the gallbladder. No pericholecystic inflammation. Central hepatic bile ducts appear stable since 2015. Pancreas: Negative. Spleen: Negative. Adrenals/Urinary Tract: Normal adrenal glands. The kidneys appear stable and within normal limits. Renal enhancement and contrast excretion is symmetric. Proximal ureters are decompressed. The urinary bladder is distended despite the presence of a Foley catheter. Estimated bladder volume 564 milliliters. Small volume of gas within the bladder. No perivesical stranding. Stomach/Bowel: Mild presacral stranding and air-fluid level in the rectum but no rectal wall thickening. Diverticulosis in the proximal sigmoid colon is moderate, but there is no active inflammation identified. Redundant proximal sigmoid. Negative descending colon. Negative transverse colon. Redundant flexures. Negative right colon. The cecum is on a lax mesentery. There appears to be a normal appendix on sagittal image 39. Decompressed and negative terminal ileum. No dilated small bowel. Negative stomach and duodenum. No abdominal free air.  No abdominal free fluid. Vascular/Lymphatic: Aortoiliac calcified atherosclerosis. Ectasia of the infrarenal abdominal aorta has mildly progressed, now 25 millimeters diameter. Major  arterial structures are patent. Portal venous system is patent. No lymphadenopathy. Reproductive: Negative. Other: Small volume pelvic free fluid with simple fluid density on the right (series 2, image 64). Musculoskeletal: No acute osseous abnormality identified. IMPRESSION: 1. A small volume of pelvic free fluid with presacral stranding is abnormal but nonspecific. There is no convincing bowel inflammation or other inflammatory process in the abdomen or pelvis. 2. Distended urinary bladder despite the presence of a Foley catheter. Estimated bladder volume 564 mL. 3. Small layering pleural effusions with  atelectasis greater on the right. 4. Sigmoid diverticulosis without active inflammation. 5. Aortic Atherosclerosis (ICD10-I70.0) and ectatic abdominal aorta at risk for aneurysm development. Recommend followup by Ultrasound in 5 years. This recommendation follows ACR consensus guidelines: White Paper of the ACR Incidental Findings Committee II on Vascular Findings. J Am Coll Radiol 2013; 10:789-794. Electronically Signed   By: Genevie Ann M.D.   On: 05/10/2018 13:53   US Carotid Bilateral (at [facility] And [facility] Only)  Result Date: 05/07/2018 CLINICAL DATA:  84 year old female with symptoms of transient ischemic attack EXAM: BILATERAL CAROTID DUPLEX ULTRASOUND TECHNIQUE: Pearline Cables scale imaging, color Doppler and duplex ultrasound were performed of bilateral carotid and vertebral arteries in the neck. COMPARISON:  84 year old female with symptoms of transient ischemic attack FINDINGS: Criteria: Quantification of carotid stenosis is based on velocity parameters that correlate the residual internal carotid diameter with NASCET-based stenosis levels, using the diameter of the distal internal carotid lumen as the denominator for stenosis measurement. The following velocity measurements were obtained: RIGHT ICA: 74/18 cm/sec CCA: 825/00 cm/sec SYSTOLIC ICA/CCA RATIO:  0.7 ECA:  132 cm/sec LEFT ICA: 41/4 cm/sec CCA: 9 9/7 cm/sec SYSTOLIC ICA/CCA RATIO:  0.4 ECA:  137 cm/sec RIGHT CAROTID ARTERY: No significant atherosclerotic plaque or evidence of stenosis in the internal carotid artery. RIGHT VERTEBRAL ARTERY:  Patent with normal antegrade flow. LEFT CAROTID ARTERY: Mild focal heterogeneous atherosclerotic plaque in the proximal internal carotid artery. By peak systolic velocity criteria, the estimated stenosis remains less than 50%. LEFT VERTEBRAL ARTERY:  Patent with normal antegrade flow. IMPRESSION: 1. Mild (1-49%) stenosis proximal left internal carotid artery secondary to focal heterogeneous atherosclerotic plaque. 2. No  significant atherosclerotic plaque or evidence of stenosis in the right internal carotid artery. 3. Vertebral arteries are patent with normal antegrade flow. Signed, Criselda Peaches, MD, [facility] Electronically Signed   By: Jacqulynn Cadet M.D.   On: 05/07/2018 10:48   Dg Chest Port 1 View  Result Date: 05/12/2018 CLINICAL DATA:  Pneumonia EXAM: PORTABLE CHEST 1 VIEW COMPARISON:  05/10/2018; 05/06/2018; chest CT-05/06/2014 FINDINGS: Grossly unchanged enlarged cardiac silhouette and mediastinal contours with atherosclerotic plaque within the thoracic aorta. The lungs are hyperexpanded with flattening of the diaphragms and blunting of the bilateral costophrenic angles. No focal airspace opacities. Trace pleural effusions are not excluded. No evidence of edema. No pneumothorax. No acute osseus abnormalities. IMPRESSION: Similar findings of cardiomegaly, lung hyperexpansion and suspected trace bilateral effusions without superimposed acute cardiopulmonary disease. Specifically, no evidence of pulmonary edema or focal airspace opacities to suggest pneumonia. Electronically Signed   By: Sandi Mariscal M.D.   On: 05/12/2018 07:35   Dg Chest Port 1 View  Result Date: 05/10/2018 CLINICAL DATA:  SVT EXAM: PORTABLE CHEST 1 VIEW COMPARISON:  05/06/2018 FINDINGS: The lungs are hyperinflated. The heart is enlarged. There is atherosclerotic calcification of the thoracic aorta. Suspect small bilateral pleural effusions. No pulmonary edema or consolidation. Skin folds overlie the chest.  IMPRESSION: Stable cardiomegaly. Small bilateral effusions.  No edema. Electronically Signed   By: Nolon Nations M.D.   On: 05/10/2018 22:11   Dg Chest Portable 1 View  Result Date: 05/06/2018 CLINICAL DATA:  Right-sided weakness, fall EXAM: PORTABLE CHEST 1 VIEW COMPARISON:  03/24/2014, CT 05/06/2014 FINDINGS: The patient's hand obscures the left lower chest.  Small right-sided pleural effusion. Enlarged cardiomediastinal silhouette with vascular congestion and hazy atelectasis or edema at the bases. No pneumothorax. IMPRESSION: 1. Cardiomegaly with vascular congestion and hazy bibasilar atelectasis or mild edema. 2. Tiny right pleural effusion Electronically Signed   By: Donavan Foil M.D.   On: 05/06/2018 14:58   Mr Jodene Nam Head/brain LF Cm  Result Date: 05/07/2018 CLINICAL DATA:  Ataxia EXAM: MRI HEAD WITHOUT CONTRAST MRA HEAD WITHOUT CONTRAST TECHNIQUE: Multiplanar, multiecho pulse sequences of the brain and surrounding structures were obtained without intravenous contrast. Angiographic images of the head were obtained using MRA technique without contrast. COMPARISON:  Head CT 05/06/2018 FINDINGS: MRI HEAD FINDINGS BRAIN: There is abnormal diffusion restriction of the left lentiform nucleus, posterior limb of the left internal capsule and medial left temporal lobe. The midline structures are normal. Early confluent hyperintense T2-weighted signal of the periventricular and deep white matter, most commonly due to chronic ischemic microangiopathy. Generalized atrophy without lobar predilection. Single focus of chronic microhemorrhage in the left frontal lobe. SKULL AND UPPER CERVICAL SPINE: The visualized skull base, calvarium, upper cervical spine and extracranial soft tissues are normal. SINUSES/ORBITS: No fluid levels or advanced mucosal thickening. No mastoid or middle ear effusion. The orbits are normal. MRA HEAD FINDINGS Intracranial internal carotid arteries: There is diminished flow related enhancement of the left internal carotid artery lacerum, cavernous and supraclinoid segments. There is a central defect within the cavernous segment and at the carotid terminus. The right ICA is normal. Anterior cerebral arteries: Normal. Middle cerebral arteries: The right MCA is normal. The left middle cerebral artery is patent proximally. However, there is limited flow  related enhancement of the distal branches. Posterior communicating arteries: Present on the left. Posterior cerebral arteries: Normal. Basilar artery: Normal. Vertebral arteries: Codominant normal. Superior cerebellar arteries: Normal. Inferior cerebellar arteries: Normal. IMPRESSION: 1. 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 No hemorrhage or mass effect. 2. Diminished flow related enhancement of the left internal carotid artery at the skull base with suspected acute thrombus. The left middle cerebral artery is normal proximally, but there is minimal flow related enhancement of the distal branches. 3. Chronic ischemic microangiopathy and generalized atrophy. Electronically Signed   By: Ulyses Jarred M.D.   On: 05/07/2018 01:58    PERFORMANCE STATUS (ECOG) : 4 - Bedbound  Review of Systems As noted above. Otherwise, a complete review of systems is negative.  Physical Exam General: NAD, frail appearing, thin, lying in bed Pulmonary: Unlabored Extremities: no edema Skin: no rashes Neurological: Alert, aphasic, right-sided hemiparesis, mumbles some words  IMPRESSION: I met with patient's son to discuss goals of care.  He says that he has had many conversations with patient in the past regarding her wishes for end-of-life care.  Patient cared for her husband who had end-stage Parkinson's.  She has expressed to the family that she would never want to live with a progressive neurological condition nor she would she want a feeding tube.  Son says candidly that he knows his mother would have preferred just to die instead of languishing in her present state.  He recognizes that patient's chance of meaningful improvement and recovery to her previous functional baseline is unlikely.  Son says he does not think that patient would have a desirable quality of life going forward.  We talked about option of rehab versus hospice.  Son would  like to pursue residential hospice.  I do feel that transfer to an inpatient hospice facility would be clinically appropriate given patient's frailty and poor oral intake.  She is only eating sips and bites at this point, which is not enough nutrition to sustain long-term.  Comfort measures and hospice philosophy of care were discussed at length including pursuing comfort feedings.  Son recognizes that there would be little benefit in IV fluids or artificial nutrition.  Again, he verbalized a desire just to keep patient comfortable and pursue hospice care.  PLAN: Continue supportive care Social work consult Hospice referral for the Hospice Home DNR   Time Total: 60 minutes  Visit consisted of counseling and education dealing with the complex and emotionally intense issues of symptom management and palliative care in the setting of serious and potentially life-threatening illness.Greater than 50%  of this time was spent counseling and coordinating care related to the above assessment and plan.  Signed by: Altha Harm, [place], NP-C, [place] (Work Cell)

## 2018-05-13 NOTE — Progress Notes (Signed)
   05/13/18 1500  Clinical Encounter Type  Visited With Patient not available  Visit Type Follow-up  Spiritual Encounters  Spiritual Needs Prayer   Chaplain attempted follow up with patient and family.  No family present and patient sleeping.  Chaplain offered silent and energetic prayer for patient, family, and care team.

## 2018-05-13 NOTE — Consult Note (Signed)
WOC Nurse wound consult note Reason for Consult: sacral pressure injury Wound type: Stage 2 pressure injury Pressure Injury POA:No Measurement: 6cm x 5.5cm x 0.1cm  Wound bed: partial thickness skin loss, pink and moist Drainage (amount, consistency, odor) scant, serosanguinous  Periwound: intact  Dressing procedure/placement/frequency: Silicone foam dressing to protect skin PureWick in place to manage moisture Turn patient every 2 hours from side to side.    Discussed POC with patient and bedside nurse.  Re consult if needed, will not follow at this time. Thanks  Akshaj Besancon [employer], RN,CWOCN, CNS, CWON-AP [phone])

## 2018-05-13 NOTE — Progress Notes (Signed)
New Hospice home referral received from [place] following a Palliative Medicine consult.Patient is a 84 year old woman with multiple medical problems including hypertension, hypothyroidism, who was admitted to [facility] on 05/06/2018 with acute CVA and rhabdomyolysis.  She had been found lying on the floor of her apartment for an unknown amount of time.  MRI of the brain revealed an acute infarct involving the left lentiform, posterior limb of the internal capsule, and medial left temporal lobe.  MRA showed possible acute thrombus in the left ICA.  Patient has right-sided hemiparesis and dysphasia as a result of the stroke.  She has poor oral intake and is only eating sips and bites.  Patient's hospitalization has been complicated by a flutter with RVR requiring a short stay in the ICU.  Palliative care was consulted to help establish goals of care and met with patient's son today. He has chosen to focus on comfort with transfer to the hospice home. Writer met in the family room with patient's son Dominica Severin to initiate education regarding hospice services, philosophy and team approach to care with good understanding voiced. Questions answered, consents signed. Plan is for discharge to the hospice home today via EMS with signed DNR in place. Hospital care team updated. Patient information faxed to referral. Report called to the hospice home, EMS notified for 5 pm pickup. Thank you for the opportunity to be involved in the care of this pateint and ehr family. Flo Shanks RN, BSN, [facility] [place], hospital liaison [phone]

## 2018-05-13 NOTE — Clinical Social Work Note (Signed)
CSW received consult from Palliative NP that patient's family is now interested in hospice home. Family requesting [facility] hosp[facility] home. CSW notified Clydie Braun, hospice liaison of referral. CSW will continue to follow for discharge planning.  Ruthe Mannan MSW, [address] [phone]

## 2018-05-13 NOTE — Discharge Summary (Signed)
[facility] - [place] [facility]   PATIENT NAME: Jenny Molina    MR#:  [id number]  DATE OF BIRTH:  Oct 03, 1923  DATE OF ADMISSION:  05/06/2018 ADMITTING PHYSICIAN: Milagros Loll, MD  DATE OF DISCHARGE: 05/13/2018  PRIMARY CARE PHYSICIAN: Marguarite Arbour, MD    ADMISSION DIAGNOSIS:  TIA (transient ischemic attack) [G45.9] Acute ischemic stroke (HCC) [I63.9]  DISCHARGE DIAGNOSIS:  Active Problems:   CVA (cerebral vascular accident) (HCC)   Pressure injury of skin   Palliative care encounter   SECONDARY DIAGNOSIS:   Past Medical History:  Diagnosis Date  . HTN (hypertension)   . Hypothyroidism     HOSPITAL COURSE:   1.  Acute CVA with thrombosis of the left carotid artery and right-sided paralysis.  The patient was given aspirin and atorvastatin here in the hospital.  Overall prognosis is poor with her weaning mental status and poor appetite.  Appreciate palliative care consultation family agreeable to hospice home.  Patient will be transferred to the hospice home today.  Stop aspirin and atorvastatin at this time. 2.  SVT and atrial fibrillation on amiodarone and short acting diltiazem.  Can give this to control heart rate at this point.  If patient unable to take medications in the future then can stop these medications 3.  Acute encephalopathy.  Encephalopathy has waxed and waned during the hospital course 4.  Hypertension 5.  Hyperlipidemia unspecified 6.  Hypothyroidism unspecified 7.  Foley catheter for comfort measures  Patient will be discharged to the hospice home.  Roxanol prescribed PRN.  DISCHARGE CONDITIONS:   Fair  CONSULTS OBTAINED:  Treatment Team:  Kym Groom, MD Thana Farr, MD Uvaldo Rising, MD Pauletta Browns, MD Lamar Blinks, MD  DRUG ALLERGIES:   Allergies  Allergen Reactions  . Benicar [Olmesartan]   . Ciprofloxacin   . Hydralazine     Feels funny  . Hydrochlorothiazide   . Penicillins   .  Sulfa Antibiotics     Sulfa drugs    DISCHARGE MEDICATIONS:   Allergies as of 05/13/2018      Reactions   Benicar [olmesartan]    Ciprofloxacin    Hydralazine    Feels funny   Hydrochlorothiazide    Penicillins    Sulfa Antibiotics    Sulfa drugs      Medication List    STOP taking these medications   ALPRAZolam 0.5 MG tablet Commonly known as:  XANAX   amLODipine 5 MG tablet Commonly known as:  NORVASC   furosemide 20 MG tablet Commonly known as:  LASIX   levothyroxine 50 MCG tablet Commonly known as:  SYNTHROID, LEVOTHROID   metoprolol 200 MG 24 hr tablet Commonly known as:  TOPROL-XL     TAKE these medications   acetaminophen 325 MG tablet Commonly known as:  TYLENOL Take 2 tablets (650 mg total) by mouth every 4 (four) hours as needed for mild pain (or temp > 37.5 C (99.5 F)).   amiodarone 200 MG tablet Commonly known as:  PACERONE Take 1 tablet (200 mg total) by mouth 2 (two) times daily.   diltiazem 60 MG tablet Commonly known as:  CARDIZEM Take 1 tablet (60 mg total) by mouth every 6 (six) hours.   morphine CONCENTRATE 10 MG/0.5ML Soln concentrated solution Take 0.5 mLs (10 mg total) by mouth every 2 (two) hours as needed for severe pain.        DISCHARGE INSTRUCTIONS:   Follow-up with hospice home team 1 day  If  you experience worsening of your admission symptoms, develop shortness of breath, life threatening emergency, suicidal or homicidal thoughts you must seek medical attention immediately by calling 911 or calling your MD immediately  if symptoms less severe.  You Must read complete instructions/literature along with all the possible adverse reactions/side effects for all the Medicines you take and that have been prescribed to you. Take any new Medicines after you have completely understood and accept all the possible adverse reactions/side effects.   Please note  You were cared for by a hospitalist during your hospital stay. If you have  any questions about your discharge medications or the care you received while you were in the hospital after you are discharged, you can call the unit and asked to speak with the hospitalist on call if the hospitalist that took care of you is not available. Once you are discharged, your primary care physician will handle any further medical issues. Please note that NO REFILLS for any discharge medications will be authorized once you are discharged, as it is imperative that you return to your primary care physician (or establish a relationship with a primary care physician if you do not have one) for your aftercare needs so that they can reassess your need for medications and monitor your lab values.    Today   CHIEF COMPLAINT:   Chief Complaint  Patient presents with  . Fall    HISTORY OF PRESENT ILLNESS:  Jenny Molina  is a 84 y.o. female found down after a fall and found to have a large stroke   VITAL SIGNS:  Blood pressure 124/84, pulse 91, temperature 98.2 F (36.8 C), temperature source Oral, resp. rate 18, height 5\' 5"  (1.651 m), weight 52.8 kg, SpO2 97 %.   PHYSICAL EXAMINATION:  GENERAL:  84 y.o.-year-old patient lying in the bed with no acute distress.  EYES: Pupils equal, round, reactive to light and accommodation. No scleral icterus. HEENT: Head atraumatic, normocephalic. Oropharynx and nasopharynx clear.  NECK:  Supple, no jugular venous distention. No thyroid enlargement, no tenderness.  LUNGS: Normal breath sounds bilaterally, no wheezing, rales,rhonchi or crepitation. No use of accessory muscles of respiration.  CARDIOVASCULAR: S1, S2 normal. No murmurs, rubs, or gallops.  ABDOMEN: Soft, non-tender, non-distended. Bowel sounds present. No organomegaly or mass.  EXTREMITIES: No pedal edema, cyanosis, or clubbing.  NEUROLOGIC: Right-sided paralysis. Sensation intact. Gait not checked.  PSYCHIATRIC: The patient is alert and answers only a few questions.  SKIN: No obvious  rash, lesion, or ulcer.   DATA REVIEW:   CBC Recent Labs  Lab 05/12/18 0411  WBC 12.0*  HGB 14.8  HCT 43.9  PLT 279    Chemistries  Recent Labs  Lab 05/10/18 2129  05/12/18 0411  NA 135   < > 138  K 4.6   < > 3.6  CL 104   < > 105  CO2 22   < > 26  GLUCOSE 210*   < > 111*  BUN 14   < > 19  CREATININE 0.83   < > 0.64  CALCIUM 8.4*   < > 8.7*  MG  --    < > 1.9  AST 20  --   --   ALT 19  --   --   ALKPHOS 47  --   --   BILITOT 1.3*  --   --    < > = values in this interval not displayed.    Cardiac Enzymes Recent Labs  Lab  05/11/18 1921  TROPONINI 0.13*    Microbiology Results  Results for orders placed or performed during the hospital encounter of 05/06/18  MRSA PCR Screening     Status: None   Collection Time: 05/10/18 11:30 PM  Result Value Ref Range Status   MRSA by PCR NEGATIVE NEGATIVE Final    Comment:        The GeneXpert MRSA Assay (FDA approved for NASAL specimens only), is one component of a comprehensive MRSA colonization surveillance program. It is not intended to diagnose MRSA infection nor to guide or monitor treatment for MRSA infections. Performed at [facility], [address] [place] [place], [place] [address]     RADIOLOGY:  Dg Chest Port 1 View  Result Date: 05/12/2018 CLINICAL DATA:  Pneumonia EXAM: PORTABLE CHEST 1 VIEW COMPARISON:  05/10/2018; 05/06/2018; chest CT-05/06/2014 FINDINGS: Grossly unchanged enlarged cardiac silhouette and mediastinal contours with atherosclerotic plaque within the thoracic aorta. The lungs are hyperexpanded with flattening of the diaphragms and blunting of the bilateral costophrenic angles. No focal airspace opacities. Trace pleural effusions are not excluded. No evidence of edema. No pneumothorax. No acute osseus abnormalities. IMPRESSION: Similar findings of cardiomegaly, lung hyperexpansion and suspected trace bilateral effusions without superimposed acute cardiopulmonary disease. Specifically,  no evidence of pulmonary edema or focal airspace opacities to suggest pneumonia. Electronically Signed   By: Simonne Come M.D.   On: 05/12/2018 07:35     Management plans discussed with the patient, family and they are in agreement.  CODE STATUS:     Code Status Orders  (From admission, onward)         Start     Ordered   05/06/18 2126  Do not attempt resuscitation (DNR)  Continuous    Question Answer Comment  In the event of cardiac or respiratory ARREST Do not call a "code blue"   In the event of cardiac or respiratory ARREST Do not perform Intubation, CPR, defibrillation or ACLS   In the event of cardiac or respiratory ARREST Use medication by any route, position, wound care, and other measures to relive pain and suffering. May use oxygen, suction and manual treatment of airway obstruction as needed for comfort.      05/06/18 2125        Code Status History    Date Active Date Inactive Code Status Order ID Comments User Context   05/06/2018 1626 05/06/2018 2125 Full Code [id number]  Milagros Loll, MD ED    Advance Directive Documentation     Most Recent Value  Type of Advance Directive  Healthcare Power of Attorney, Living will  Pre-existing out of facility DNR order (yellow form or pink MOST form)  -  "MOST" Form in Place?  -      TOTAL TIME TAKING CARE OF THIS PATIENT: 32 minutes.    Alford Highland M.D on 05/13/2018 at 11:12 AM  Between 7am to 6pm - Pager - [phone]  After 6pm go to [email or web address] - password [email or web address]  [facility] Office  [phone]  CC: Primary care physician; Marguarite Arbour, MD

## 2018-06-15 DEATH — deceased

## 2018-12-25 IMAGING — DX DG CHEST 1V PORT
1 series · 1 of 1 positions shown · non-contrast
Comparison: 05/10/2018; 05/06/2018; chest CT-05/06/2014

CLINICAL DATA: Pneumonia

EXAM:
PORTABLE CHEST 1 VIEW

[chest ap]
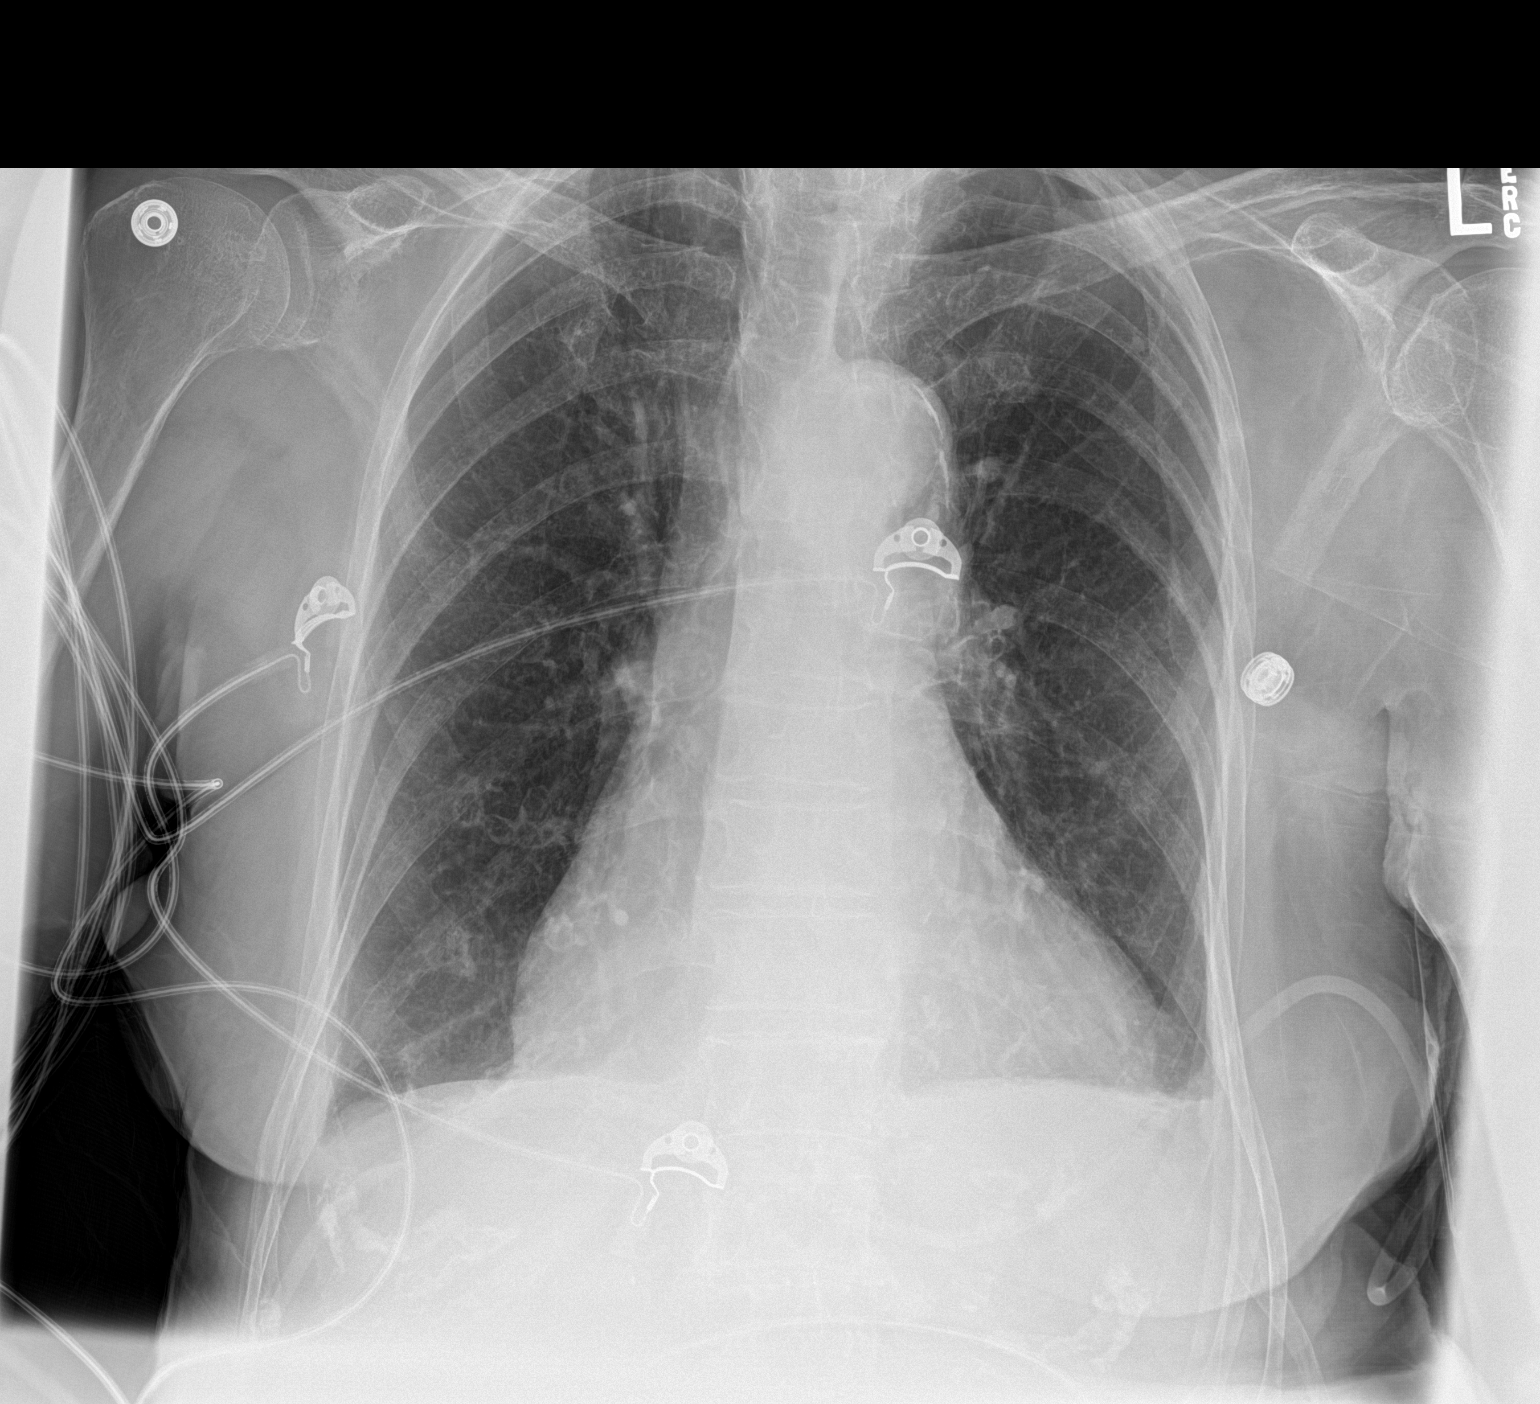

[1 of 1 positions shown; findings below may reference images not displayed]

FINDINGS: Grossly unchanged enlarged cardiac silhouette and mediastinal
contours with atherosclerotic plaque within the thoracic aorta. The
lungs are hyperexpanded with flattening of the diaphragms and
blunting of the bilateral costophrenic angles. No focal airspace
opacities. Trace pleural effusions are not excluded. No evidence of
edema. No pneumothorax. No acute osseus abnormalities.
IMPRESSION: Similar findings of cardiomegaly, lung hyperexpansion and suspected
trace bilateral effusions without superimposed acute cardiopulmonary
disease. Specifically, no evidence of pulmonary edema or focal
airspace opacities to suggest pneumonia.
